# Patient Record
Sex: Female | Born: 1955 | Race: White | Hispanic: No | Smoking: Former smoker
Health system: Southern US, Community
[De-identification: ages and names within clinical notes are randomized; demographics above are authoritative.]

## PROBLEM LIST (undated history)

## (undated) DIAGNOSIS — W3400XA Accidental discharge from unspecified firearms or gun, initial encounter: Secondary | ICD-10-CM

## (undated) DIAGNOSIS — D649 Anemia, unspecified: Secondary | ICD-10-CM

## (undated) DIAGNOSIS — F0390 Unspecified dementia without behavioral disturbance: Secondary | ICD-10-CM

## (undated) DIAGNOSIS — F3111 Bipolar disorder, current episode manic without psychotic features, mild: Secondary | ICD-10-CM

## (undated) DIAGNOSIS — R51 Headache: Secondary | ICD-10-CM

## (undated) DIAGNOSIS — S069X9A Unspecified intracranial injury with loss of consciousness of unspecified duration, initial encounter: Secondary | ICD-10-CM

## (undated) DIAGNOSIS — E785 Hyperlipidemia, unspecified: Secondary | ICD-10-CM

## (undated) DIAGNOSIS — M549 Dorsalgia, unspecified: Secondary | ICD-10-CM

## (undated) HISTORY — DX: Accidental discharge from unspecified firearms or gun, initial encounter: W34.00XA

## (undated) HISTORY — PX: ANKLE FRACTURE SURGERY: SHX122

## (undated) HISTORY — PX: LEG SURGERY: SHX1003

## (undated) HISTORY — DX: Anemia, unspecified: D64.9

## (undated) HISTORY — DX: Dorsalgia, unspecified: M54.9

## (undated) HISTORY — DX: Unspecified dementia, unspecified severity, without behavioral disturbance, psychotic disturbance, mood disturbance, and anxiety: F03.90

## (undated) HISTORY — DX: Hyperlipidemia, unspecified: E78.5

## (undated) HISTORY — DX: Bipolar disorder, current episode manic without psychotic features, mild: F31.11

---
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
## 1994-06-27 DIAGNOSIS — S069X9A Unspecified intracranial injury with loss of consciousness of unspecified duration, initial encounter: Secondary | ICD-10-CM

## 1994-06-27 HISTORY — DX: Unspecified intracranial injury with loss of consciousness of unspecified duration, initial encounter: S06.9X9A

## 1997-10-29 ENCOUNTER — Emergency Department (HOSPITAL_COMMUNITY): Admission: EM | Admit: 1997-10-29 | Discharge: 1997-10-29 | Payer: Self-pay | Admitting: Emergency Medicine

## 1998-02-08 ENCOUNTER — Emergency Department (HOSPITAL_COMMUNITY): Admission: EM | Admit: 1998-02-08 | Discharge: 1998-02-08 | Payer: Self-pay | Admitting: Emergency Medicine

## 1998-03-13 ENCOUNTER — Ambulatory Visit (HOSPITAL_COMMUNITY): Admission: RE | Admit: 1998-03-13 | Discharge: 1998-03-13 | Payer: Self-pay

## 1999-01-08 ENCOUNTER — Encounter: Payer: Self-pay | Admitting: Specialist

## 1999-01-09 ENCOUNTER — Inpatient Hospital Stay (HOSPITAL_COMMUNITY): Admission: EM | Admit: 1999-01-09 | Discharge: 1999-01-25 | Payer: Self-pay

## 1999-01-21 ENCOUNTER — Encounter: Payer: Self-pay | Admitting: Specialist

## 1999-03-12 ENCOUNTER — Encounter: Payer: Self-pay | Admitting: Specialist

## 1999-03-12 ENCOUNTER — Inpatient Hospital Stay (HOSPITAL_COMMUNITY): Admission: EM | Admit: 1999-03-12 | Discharge: 1999-03-18 | Payer: Self-pay | Admitting: Specialist

## 1999-04-08 ENCOUNTER — Encounter: Admission: RE | Admit: 1999-04-08 | Discharge: 1999-04-08 | Payer: Self-pay | Admitting: Specialist

## 1999-06-17 ENCOUNTER — Emergency Department (HOSPITAL_COMMUNITY): Admission: EM | Admit: 1999-06-17 | Discharge: 1999-06-17 | Payer: Self-pay | Admitting: *Deleted

## 2005-04-28 ENCOUNTER — Emergency Department (HOSPITAL_COMMUNITY): Admission: EM | Admit: 2005-04-28 | Discharge: 2005-04-28 | Payer: Self-pay | Admitting: *Deleted

## 2005-05-04 ENCOUNTER — Ambulatory Visit: Payer: Self-pay | Admitting: Internal Medicine

## 2005-05-05 ENCOUNTER — Ambulatory Visit (HOSPITAL_COMMUNITY): Admission: RE | Admit: 2005-05-05 | Discharge: 2005-05-05 | Payer: Self-pay | Admitting: Internal Medicine

## 2005-07-06 ENCOUNTER — Ambulatory Visit: Payer: Self-pay | Admitting: Internal Medicine

## 2005-07-08 ENCOUNTER — Ambulatory Visit (HOSPITAL_COMMUNITY): Admission: RE | Admit: 2005-07-08 | Discharge: 2005-07-08 | Payer: Self-pay | Admitting: Internal Medicine

## 2006-02-11 ENCOUNTER — Emergency Department (HOSPITAL_COMMUNITY): Admission: EM | Admit: 2006-02-11 | Discharge: 2006-02-11 | Payer: Self-pay | Admitting: Emergency Medicine

## 2006-02-16 ENCOUNTER — Ambulatory Visit: Payer: Self-pay | Admitting: Internal Medicine

## 2006-03-02 ENCOUNTER — Ambulatory Visit (HOSPITAL_COMMUNITY): Admission: RE | Admit: 2006-03-02 | Discharge: 2006-03-02 | Payer: Self-pay | Admitting: Obstetrics

## 2006-12-04 ENCOUNTER — Encounter (INDEPENDENT_AMBULATORY_CARE_PROVIDER_SITE_OTHER): Payer: Self-pay | Admitting: Infectious Diseases

## 2006-12-04 ENCOUNTER — Ambulatory Visit: Payer: Self-pay | Admitting: Internal Medicine

## 2006-12-04 DIAGNOSIS — D649 Anemia, unspecified: Secondary | ICD-10-CM

## 2006-12-04 DIAGNOSIS — R4182 Altered mental status, unspecified: Secondary | ICD-10-CM

## 2006-12-04 DIAGNOSIS — F3110 Bipolar disorder, current episode manic without psychotic features, unspecified: Secondary | ICD-10-CM | POA: Insufficient documentation

## 2006-12-04 LAB — CONVERTED CEMR LAB
ALT: <8 units/L (ref 0–35)
AST: 11 units/L (ref 0–37)
Albumin: 4.2 g/dL (ref 3.5–5.2)
Alkaline Phosphatase: 53 units/L (ref 39–117)
Amphetamine Screen, Ur: NEGATIVE
BUN: 21 mg/dL (ref 6–23)
Barbiturate Quant, Ur: NEGATIVE
Benzodiazepines.: NEGATIVE
CO2: 26 meq/L (ref 19–32)
Calcium: 9.9 mg/dL (ref 8.4–10.5)
Chloride: 105 meq/L (ref 96–112)
Cocaine Metabolites: NEGATIVE
Creatinine, Ser: 0.94 mg/dL (ref 0.40–1.20)
Creatinine,U: 381.8 mg/dL
Glucose, Bld: 77 mg/dL (ref 70–99)
HCT: 44.2 % (ref 36.0–46.0)
Hemoglobin: 14.5 g/dL (ref 12.0–15.0)
MCHC: 32.8 g/dL (ref 30.0–36.0)
MCV: 91.5 fL (ref 78.0–100.0)
Marijuana Metabolite: NEGATIVE
Methadone: NEGATIVE
Opiates: NEGATIVE
Phencyclidine (PCP): NEGATIVE
Platelets: 213 10*3/uL (ref 150–400)
Potassium: 4.4 meq/L (ref 3.5–5.3)
Propoxyphene: NEGATIVE
RBC: 4.83 M/uL (ref 3.87–5.11)
RDW: 13.6 % (ref 11.5–14.0)
Sodium: 143 meq/L (ref 135–145)
TSH: 2.587 microintl units/mL (ref 0.350–5.50)
Total Bilirubin: 0.6 mg/dL (ref 0.3–1.2)
Total Protein: 7 g/dL (ref 6.0–8.3)
Valproic Acid Lvl: 67.6 ug/mL (ref 50.0–100.0)
Vitamin B-12: 507 pg/mL (ref 211–911)
WBC: 4.8 10*3/uL (ref 4.0–10.5)

## 2006-12-08 ENCOUNTER — Encounter (INDEPENDENT_AMBULATORY_CARE_PROVIDER_SITE_OTHER): Payer: Self-pay | Admitting: Infectious Diseases

## 2006-12-14 ENCOUNTER — Telehealth: Payer: Self-pay | Admitting: *Deleted

## 2006-12-15 ENCOUNTER — Ambulatory Visit (HOSPITAL_COMMUNITY): Admission: RE | Admit: 2006-12-15 | Discharge: 2006-12-15 | Payer: Self-pay | Admitting: Infectious Diseases

## 2007-02-01 ENCOUNTER — Ambulatory Visit: Payer: Self-pay | Admitting: Infectious Disease

## 2007-02-13 ENCOUNTER — Ambulatory Visit (HOSPITAL_COMMUNITY): Admission: RE | Admit: 2007-02-13 | Discharge: 2007-02-13 | Payer: Self-pay | Admitting: Internal Medicine

## 2007-03-19 ENCOUNTER — Encounter (INDEPENDENT_AMBULATORY_CARE_PROVIDER_SITE_OTHER): Payer: Self-pay | Admitting: Internal Medicine

## 2007-03-19 ENCOUNTER — Ambulatory Visit: Payer: Self-pay | Admitting: Internal Medicine

## 2007-03-19 DIAGNOSIS — R634 Abnormal weight loss: Secondary | ICD-10-CM

## 2007-03-19 LAB — CONVERTED CEMR LAB
Cholesterol: 215 mg/dL — ABNORMAL HIGH (ref 0–200)
HDL: 58 mg/dL (ref >39)
LDL Cholesterol: 126 mg/dL — ABNORMAL HIGH (ref 0–99)
Total CHOL/HDL Ratio: 3.7
Triglycerides: 153 mg/dL — ABNORMAL HIGH (ref <150)
VLDL: 31 mg/dL (ref 0–40)

## 2007-05-10 ENCOUNTER — Ambulatory Visit: Payer: Self-pay | Admitting: Internal Medicine

## 2007-06-06 ENCOUNTER — Encounter (INDEPENDENT_AMBULATORY_CARE_PROVIDER_SITE_OTHER): Payer: Self-pay | Admitting: Internal Medicine

## 2007-07-16 ENCOUNTER — Encounter (INDEPENDENT_AMBULATORY_CARE_PROVIDER_SITE_OTHER): Payer: Self-pay | Admitting: Internal Medicine

## 2007-08-13 ENCOUNTER — Encounter (INDEPENDENT_AMBULATORY_CARE_PROVIDER_SITE_OTHER): Payer: Self-pay | Admitting: Internal Medicine

## 2007-08-29 ENCOUNTER — Encounter (INDEPENDENT_AMBULATORY_CARE_PROVIDER_SITE_OTHER): Payer: Self-pay | Admitting: Internal Medicine

## 2007-08-29 ENCOUNTER — Ambulatory Visit: Payer: Self-pay | Admitting: *Deleted

## 2007-09-04 ENCOUNTER — Ambulatory Visit: Payer: Self-pay | Admitting: Hospitalist

## 2007-09-13 ENCOUNTER — Encounter (INDEPENDENT_AMBULATORY_CARE_PROVIDER_SITE_OTHER): Payer: Self-pay | Admitting: Internal Medicine

## 2008-06-05 ENCOUNTER — Ambulatory Visit: Payer: Self-pay | Admitting: Internal Medicine

## 2008-06-22 IMAGING — US US PELVIS COMPLETE MODIFY
1 series · 13 of 25 positions shown · non-contrast
Comparison: none

CLINICAL DATA: No menstrual cycles for 10 years with menses beginning in Sunday December, 2005.
 TRANSABDOMINAL AND TRANSVAGINAL PELVIC ULTRASOUND ? 03/02/06:
TECHNIQUE: Both transabdominal and transvaginal ultrasound examinations of the pelvis were performed including evaluation of the uterus, ovaries, adnexal regions, and pelvic cul-de-sac.

[Series 1: us pelvis complete modify · 0.35mm/px · 13 of 48 slices shown]
[im 1/48]
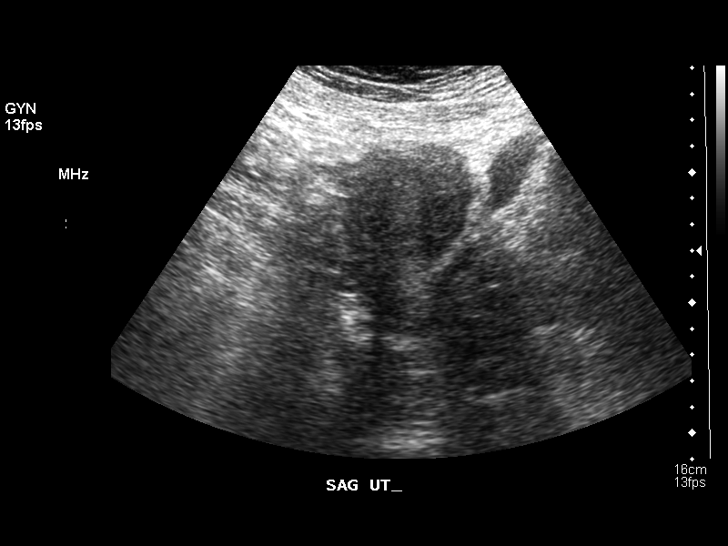
[im 4/48]
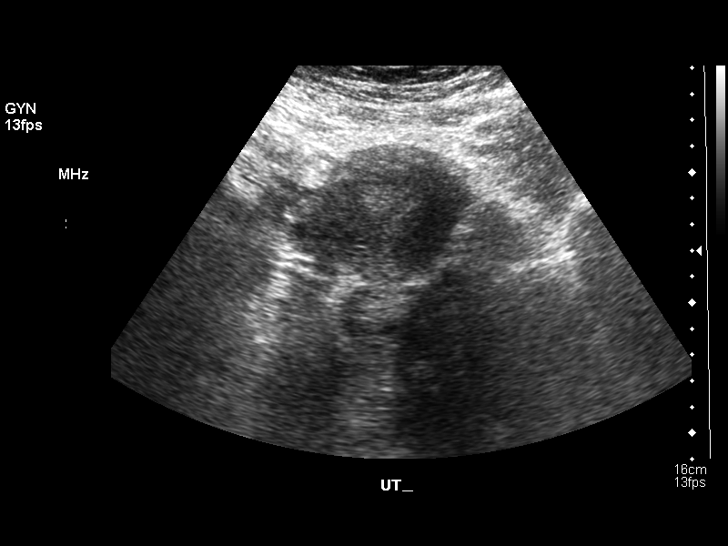
[im 8/48]
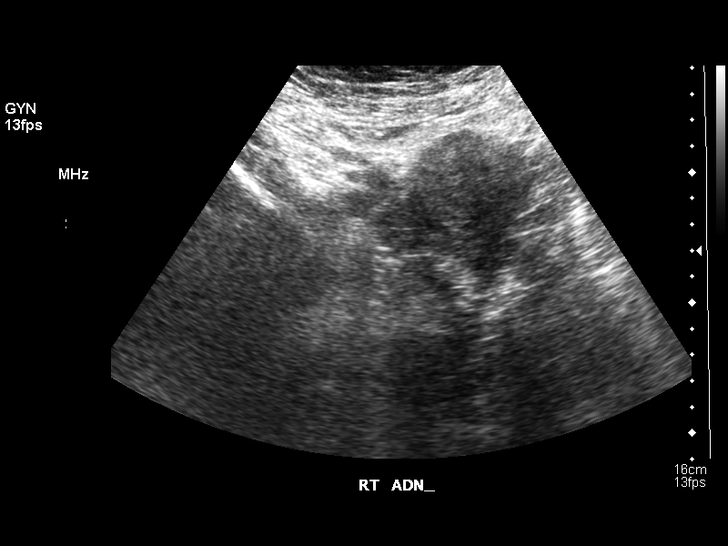
[im 12/48]
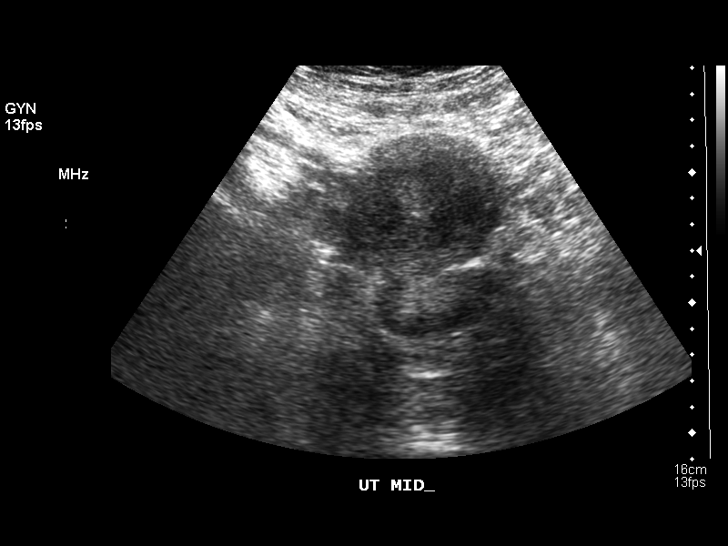
[im 16/48]
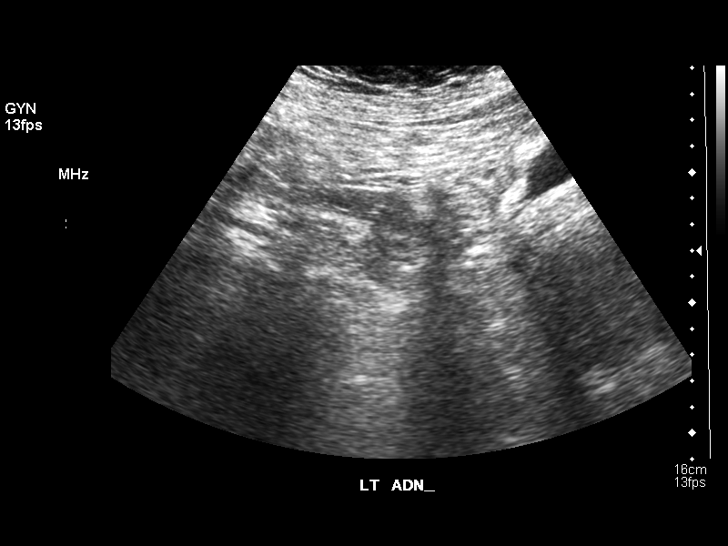
[im 20/48]
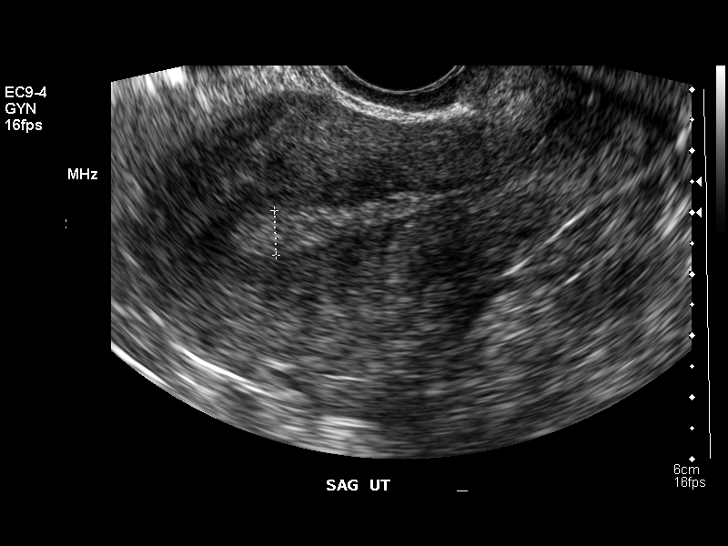
[im 24/48]
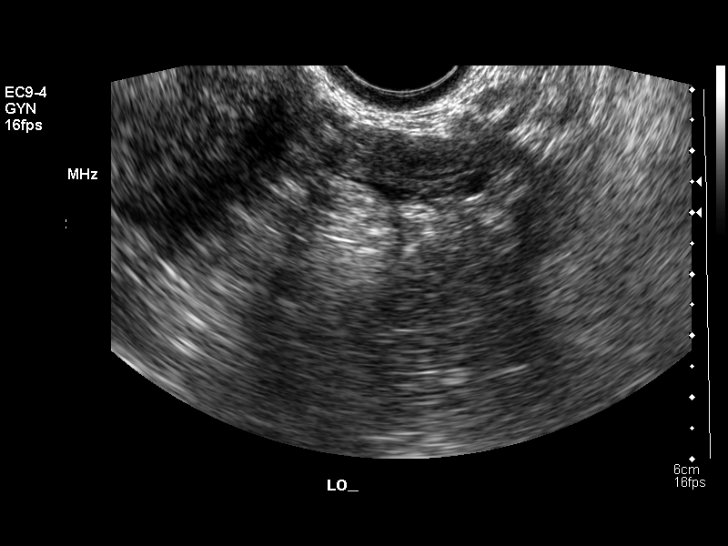
[im 28/48]
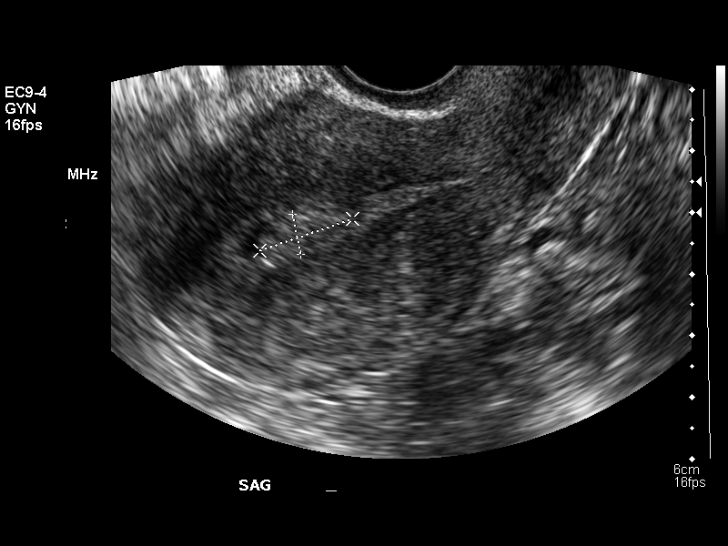
[im 32/48]
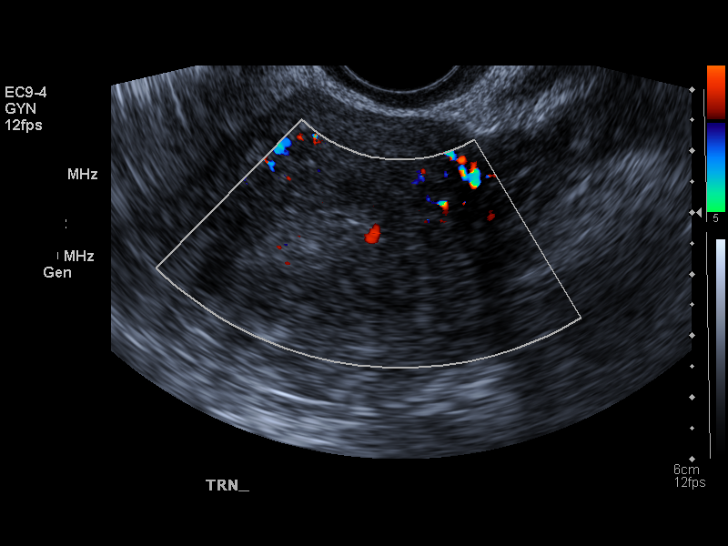
[im 36/48]
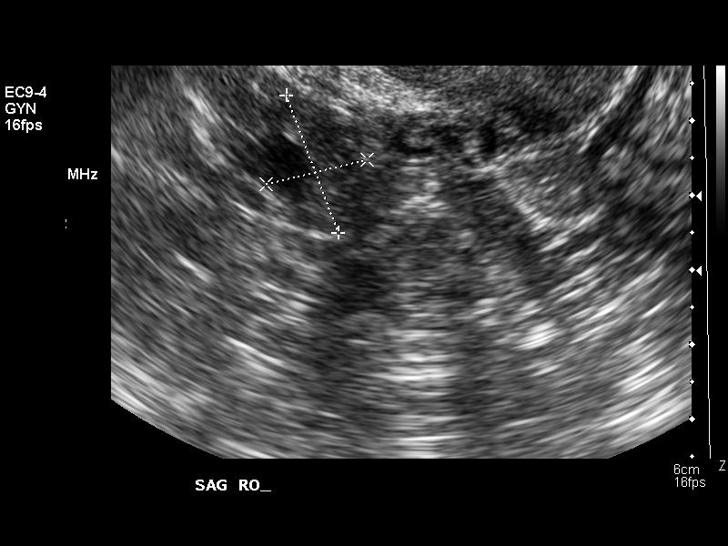
[im 40/48]
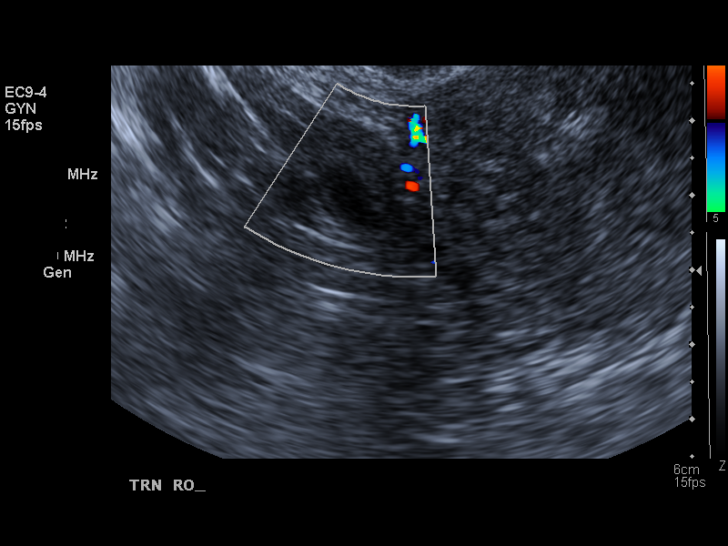
[im 44/48]
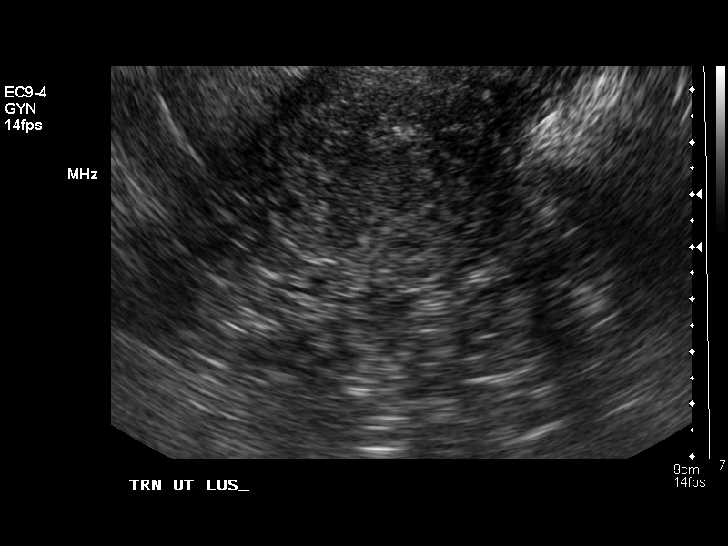
[im 48/48]
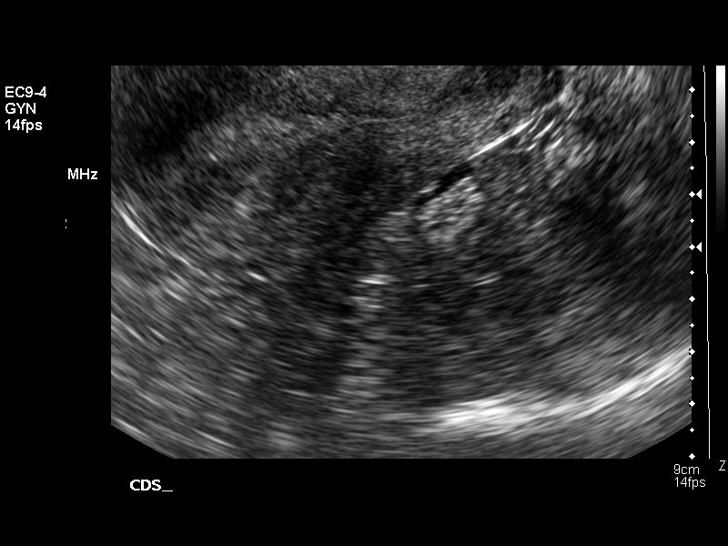

[13 of 25 positions shown; findings below may reference images not displayed]

FINDINGS: Multiple images of the uterus and adnexa were obtained using transabdominal and endovaginal approaches.  The uterus has a sagittal length of 7.4 cm, an AP width of 4.8 cm, and a transverse width of 5.4 cm.  A homogeneous uterine myometrium is seen.  
 The endometrial canal has a small amount of fluid within the canal.  There is a focal area of thickening identified emanating off of the right lateral upper uterine segment portion of the endometrial canal measuring 1.6 x 0.7 x 1.0 cm.  While no clearly defined feeding vessel could be identified with color Doppler assessment, the appearance is most compatible with a focal polyp.  The remainder of the endometrial lining is thin with a single layer measurement of 1.9 mm.
 Both ovaries are seen with the right ovary measuring 1.2 x 2.0 x 1.4 cm.  This contains a unilocular simple cyst measuring 0.9 x 0.7 cm.  Given the patient?s history of 10 years of amenses, this most likely represents a small benign postmenopausal cyst, and follow-up can be undertaken in 3 months for initial short-term reassessment.  The left ovary has a normal appearance measuring 1.9 x 2.2 x 1.5 cm.  No cul-de-sac or paraovarian fluid is seen, and no separate adnexal masses are noted.
IMPRESSION: 1.  Findings suggestive of a focal endometrial polyp with size and location as described above. 
 2.  Small unilocular simple cyst in the right ovary most compatible with a small benign postmenopausal cyst.  Short-term follow-up in three months would be recommended for initial short-term reassessment.  Normal left ovary and myometrium.

## 2008-09-22 ENCOUNTER — Ambulatory Visit: Payer: Self-pay | Admitting: Internal Medicine

## 2008-09-23 ENCOUNTER — Encounter (INDEPENDENT_AMBULATORY_CARE_PROVIDER_SITE_OTHER): Payer: Self-pay | Admitting: Internal Medicine

## 2008-09-24 ENCOUNTER — Encounter (INDEPENDENT_AMBULATORY_CARE_PROVIDER_SITE_OTHER): Payer: Self-pay | Admitting: Internal Medicine

## 2008-09-24 ENCOUNTER — Ambulatory Visit: Payer: Self-pay | Admitting: Internal Medicine

## 2008-09-24 ENCOUNTER — Ambulatory Visit (HOSPITAL_COMMUNITY): Admission: RE | Admit: 2008-09-24 | Discharge: 2008-09-24 | Payer: Self-pay | Admitting: Internal Medicine

## 2008-09-24 LAB — CONVERTED CEMR LAB
ALT: 8 units/L (ref 0–35)
AST: 9 units/L (ref 0–37)
Albumin: 3.9 g/dL (ref 3.5–5.2)
Alkaline Phosphatase: 61 units/L (ref 39–117)
BUN: 19 mg/dL (ref 6–23)
Basophils Absolute: 0 10*3/uL (ref 0.0–0.1)
Basophils Relative: 0 % (ref 0–1)
CO2: 23 meq/L (ref 19–32)
Calcium: 8.9 mg/dL (ref 8.4–10.5)
Chloride: 108 meq/L (ref 96–112)
Cholesterol: 237 mg/dL — ABNORMAL HIGH (ref 0–200)
Creatinine, Ser: 0.95 mg/dL (ref 0.40–1.20)
Eosinophils Absolute: 0.1 10*3/uL (ref 0.0–0.7)
Eosinophils Relative: 2 % (ref 0–5)
GFR calc Af Amer: >60 mL/min (ref >60)
GFR calc non Af Amer: >60 mL/min (ref >60)
Glucose, Bld: 91 mg/dL (ref 70–99)
HCT: 37 % (ref 36.0–46.0)
HDL: 49 mg/dL (ref >39)
Hemoglobin: 12.5 g/dL (ref 12.0–15.0)
LDL Cholesterol: 123 mg/dL — ABNORMAL HIGH (ref 0–99)
Lymphocytes Relative: 33 % (ref 12–46)
Lymphs Abs: 2 10*3/uL (ref 0.7–4.0)
MCHC: 33.8 g/dL (ref 30.0–36.0)
MCV: 88.7 fL (ref 78.0–100.0)
Monocytes Absolute: 0.5 10*3/uL (ref 0.1–1.0)
Monocytes Relative: 8 % (ref 3–12)
Neutro Abs: 3.5 10*3/uL (ref 1.7–7.7)
Neutrophils Relative %: 57 % (ref 43–77)
Platelets: 220 10*3/uL (ref 150–400)
Potassium: 4.4 meq/L (ref 3.5–5.3)
RBC: 4.17 M/uL (ref 3.87–5.11)
RDW: 14.5 % (ref 11.5–15.5)
Sodium: 143 meq/L (ref 135–145)
Total Bilirubin: 0.2 mg/dL — ABNORMAL LOW (ref 0.3–1.2)
Total CHOL/HDL Ratio: 4.8
Total Protein: 6.5 g/dL (ref 6.0–8.3)
Triglycerides: 324 mg/dL — ABNORMAL HIGH (ref <150)
VLDL: 65 mg/dL — ABNORMAL HIGH (ref 0–40)
Valproic Acid Lvl: 63.3 ug/mL (ref 50.0–100.0)
WBC: 6 10*3/uL (ref 4.0–10.5)

## 2008-10-13 ENCOUNTER — Telehealth (INDEPENDENT_AMBULATORY_CARE_PROVIDER_SITE_OTHER): Payer: Self-pay | Admitting: Internal Medicine

## 2008-10-13 ENCOUNTER — Encounter (INDEPENDENT_AMBULATORY_CARE_PROVIDER_SITE_OTHER): Payer: Self-pay | Admitting: Internal Medicine

## 2008-11-10 ENCOUNTER — Encounter (INDEPENDENT_AMBULATORY_CARE_PROVIDER_SITE_OTHER): Payer: Self-pay | Admitting: Internal Medicine

## 2008-11-12 ENCOUNTER — Telehealth: Payer: Self-pay | Admitting: *Deleted

## 2008-11-12 ENCOUNTER — Encounter (INDEPENDENT_AMBULATORY_CARE_PROVIDER_SITE_OTHER): Payer: Self-pay | Admitting: Internal Medicine

## 2008-11-12 ENCOUNTER — Ambulatory Visit: Payer: Self-pay | Admitting: *Deleted

## 2008-11-12 DIAGNOSIS — R197 Diarrhea, unspecified: Secondary | ICD-10-CM | POA: Insufficient documentation

## 2008-11-12 LAB — CONVERTED CEMR LAB
Basophils Absolute: 0 10*3/uL (ref 0.0–0.1)
Basophils Relative: 0 % (ref 0–1)
Bilirubin Urine: NEGATIVE
Eosinophils Absolute: 0 10*3/uL (ref 0.0–0.7)
Eosinophils Relative: 0 % (ref 0–5)
Glucose, Urine, Semiquant: NEGATIVE
HCT: 41 % (ref 36.0–46.0)
Hemoglobin: 13.6 g/dL (ref 12.0–15.0)
Lymphocytes Relative: 12 % (ref 12–46)
Lymphs Abs: 0.8 10*3/uL (ref 0.7–4.0)
MCHC: 33.2 g/dL (ref 30.0–36.0)
MCV: 85.2 fL (ref 78.0–100.0)
Monocytes Absolute: 0.9 10*3/uL (ref 0.1–1.0)
Monocytes Relative: 13 % — ABNORMAL HIGH (ref 3–12)
Neutro Abs: 5.1 10*3/uL (ref 1.7–7.7)
Neutrophils Relative %: 75 % (ref 43–77)
Nitrite: NEGATIVE
Platelets: 228 10*3/uL (ref 150–400)
Protein, U semiquant: 100
RBC: 4.81 M/uL (ref 3.87–5.11)
RDW: 14.8 % (ref 11.5–15.5)
Specific Gravity, Urine: >=1.03
Urobilinogen, UA: 0.2
WBC: 6.7 10*3/uL (ref 4.0–10.5)
pH: 6

## 2009-04-23 ENCOUNTER — Encounter (INDEPENDENT_AMBULATORY_CARE_PROVIDER_SITE_OTHER): Payer: Self-pay | Admitting: Internal Medicine

## 2009-05-19 ENCOUNTER — Telehealth (INDEPENDENT_AMBULATORY_CARE_PROVIDER_SITE_OTHER): Payer: Self-pay | Admitting: Internal Medicine

## 2009-08-13 ENCOUNTER — Ambulatory Visit: Payer: Self-pay | Admitting: Internal Medicine

## 2009-08-13 ENCOUNTER — Encounter (INDEPENDENT_AMBULATORY_CARE_PROVIDER_SITE_OTHER): Payer: Self-pay | Admitting: Internal Medicine

## 2009-08-13 LAB — CONVERTED CEMR LAB
ALT: 14 units/L (ref 0–35)
AST: 23 units/L (ref 0–37)
Albumin: 3.7 g/dL (ref 3.5–5.2)
Alkaline Phosphatase: 62 units/L (ref 39–117)
BUN: 22 mg/dL (ref 6–23)
CO2: 23 meq/L (ref 19–32)
Calcium: 9.4 mg/dL (ref 8.4–10.5)
Candida species: NEGATIVE
Chlamydia, DNA Probe: NEGATIVE
Creatinine, Ser: 1.08 mg/dL (ref 0.40–1.20)
GC Probe Amp, Genital: NEGATIVE
Gardnerella vaginalis: NEGATIVE
Glucose, Bld: 81 mg/dL (ref 70–99)
Pap Smear: NEGATIVE
Potassium: 4.6 meq/L (ref 3.5–5.3)
Sodium: 142 meq/L (ref 135–145)
Total Bilirubin: 0.2 mg/dL — ABNORMAL LOW (ref 0.3–1.2)
Total Protein: 6.3 g/dL (ref 6.0–8.3)

## 2009-08-13 LAB — HM PAP SMEAR: HM Pap smear: NEGATIVE

## 2009-08-26 LAB — CONVERTED CEMR LAB
HDL: 44 mg/dL (ref >39)
Total CHOL/HDL Ratio: 6.5
Triglycerides: 526 mg/dL — ABNORMAL HIGH (ref <150)

## 2009-10-01 ENCOUNTER — Telehealth (INDEPENDENT_AMBULATORY_CARE_PROVIDER_SITE_OTHER): Payer: Self-pay | Admitting: Internal Medicine

## 2009-10-06 ENCOUNTER — Encounter (INDEPENDENT_AMBULATORY_CARE_PROVIDER_SITE_OTHER): Payer: Self-pay | Admitting: Internal Medicine

## 2009-10-30 ENCOUNTER — Ambulatory Visit: Payer: Self-pay | Admitting: Internal Medicine

## 2009-11-16 ENCOUNTER — Ambulatory Visit: Payer: Self-pay | Admitting: Internal Medicine

## 2009-11-17 LAB — CONVERTED CEMR LAB
ALT: 9 units/L (ref 0–35)
AST: 15 units/L (ref 0–37)
Albumin: 3.8 g/dL (ref 3.5–5.2)
Alkaline Phosphatase: 59 units/L (ref 39–117)
BUN: 28 mg/dL — ABNORMAL HIGH (ref 6–23)
Bilirubin, Direct: 0.1 mg/dL (ref 0.0–0.3)
CO2: 27 meq/L (ref 19–32)
Calcium: 9.6 mg/dL (ref 8.4–10.5)
Chloride: 106 meq/L (ref 96–112)
Cholesterol: 308 mg/dL — ABNORMAL HIGH (ref 0–200)
Creatinine, Ser: 1.04 mg/dL (ref 0.40–1.20)
Glucose, Bld: 85 mg/dL (ref 70–99)
HCT: 40.3 % (ref 36.0–46.0)
HDL: 52 mg/dL (ref >39)
Hemoglobin: 12.3 g/dL (ref 12.0–15.0)
Indirect Bilirubin: 0.2 mg/dL (ref 0.0–0.9)
LDL Cholesterol: 190 mg/dL — ABNORMAL HIGH (ref 0–99)
MCHC: 30.5 g/dL (ref 30.0–36.0)
MCV: 92.2 fL (ref >=78.0)
Platelets: 231 10*3/uL (ref 150–400)
Potassium: 4.8 meq/L (ref 3.5–5.3)
RBC: 4.37 M/uL (ref 3.87–5.11)
RDW: 14.5 % (ref 11.5–15.5)
Sodium: 143 meq/L (ref 135–145)
Total Bilirubin: 0.3 mg/dL (ref 0.3–1.2)
Total CHOL/HDL Ratio: 5.9
Total Protein: 6.3 g/dL (ref 6.0–8.3)
Triglycerides: 331 mg/dL — ABNORMAL HIGH (ref <150)
VLDL: 66 mg/dL — ABNORMAL HIGH (ref 0–40)
Valproic Acid Lvl: 72.1 ug/mL (ref >=50.0)
WBC: 5.2 10*3/uL (ref 4.0–10.5)

## 2009-12-01 ENCOUNTER — Telehealth (INDEPENDENT_AMBULATORY_CARE_PROVIDER_SITE_OTHER): Payer: Self-pay | Admitting: Internal Medicine

## 2009-12-16 ENCOUNTER — Telehealth: Payer: Self-pay | Admitting: Internal Medicine

## 2009-12-30 ENCOUNTER — Telehealth (INDEPENDENT_AMBULATORY_CARE_PROVIDER_SITE_OTHER): Payer: Self-pay | Admitting: *Deleted

## 2010-01-20 ENCOUNTER — Ambulatory Visit: Payer: Self-pay | Admitting: Internal Medicine

## 2010-01-20 DIAGNOSIS — E785 Hyperlipidemia, unspecified: Secondary | ICD-10-CM | POA: Insufficient documentation

## 2010-01-20 LAB — CONVERTED CEMR LAB
AST: 11 units/L (ref 0–37)
Albumin: 3.9 g/dL (ref 3.5–5.2)
Alkaline Phosphatase: 69 units/L (ref 39–117)
BUN: 17 mg/dL (ref 6–23)
Chloride: 104 meq/L (ref 96–112)
Cholesterol: 290 mg/dL — ABNORMAL HIGH (ref 0–200)
Potassium: 5.5 meq/L — ABNORMAL HIGH (ref 3.5–5.3)
Sodium: 142 meq/L (ref 135–145)
Total Bilirubin: 0.3 mg/dL (ref 0.3–1.2)
Total CHOL/HDL Ratio: 6.3
Total Protein: 6.6 g/dL (ref 6.0–8.3)

## 2010-01-28 ENCOUNTER — Ambulatory Visit (HOSPITAL_COMMUNITY): Admission: RE | Admit: 2010-01-28 | Discharge: 2010-01-28 | Payer: Self-pay | Admitting: Internal Medicine

## 2010-01-28 LAB — HM MAMMOGRAPHY: HM Mammogram: NEGATIVE

## 2010-02-23 ENCOUNTER — Ambulatory Visit: Payer: Self-pay | Admitting: Internal Medicine

## 2010-03-11 ENCOUNTER — Ambulatory Visit (HOSPITAL_COMMUNITY): Admission: RE | Admit: 2010-03-11 | Discharge: 2010-03-11 | Payer: Self-pay | Admitting: Gastroenterology

## 2010-04-08 ENCOUNTER — Ambulatory Visit (HOSPITAL_COMMUNITY)
Admission: RE | Admit: 2010-04-08 | Discharge: 2010-04-08 | Payer: Self-pay | Source: Home / Self Care | Admitting: Gastroenterology

## 2010-05-05 ENCOUNTER — Emergency Department (HOSPITAL_COMMUNITY): Admission: EM | Admit: 2010-05-05 | Discharge: 2010-05-05 | Payer: Self-pay | Admitting: Emergency Medicine

## 2010-05-25 ENCOUNTER — Ambulatory Visit: Payer: Self-pay | Admitting: Internal Medicine

## 2010-05-25 DIAGNOSIS — M549 Dorsalgia, unspecified: Secondary | ICD-10-CM | POA: Insufficient documentation

## 2010-05-31 ENCOUNTER — Telehealth (INDEPENDENT_AMBULATORY_CARE_PROVIDER_SITE_OTHER): Payer: Self-pay | Admitting: *Deleted

## 2010-07-18 ENCOUNTER — Encounter: Payer: Self-pay | Admitting: Infectious Diseases

## 2010-07-18 ENCOUNTER — Encounter: Payer: Self-pay | Admitting: *Deleted

## 2010-07-27 ENCOUNTER — Telehealth: Payer: Self-pay | Admitting: Internal Medicine

## 2010-07-29 NOTE — Progress Notes (Signed)
Summary: med refill/gp  Phone Note Refill Request Message from:  Fax from Pharmacy on December 01, 2009 2:41 PM  Refills Requested: Medication #1:  HEMOCYTE 324 MG TABS Take 1 tablet by mouth once a day   Last Refilled: 11/19/2009  Method Requested: Electronic Initial call taken by: Chinita Pester RN,  December 01, 2009 2:41 PM  Follow-up for Phone Call       Follow-up by: Jason Coop MD,  December 01, 2009 3:18 PM    Prescriptions: HEMOCYTE 324 MG TABS (FERROUS FUMARATE) Take 1 tablet by mouth once a day  #30 x 2   Entered and Authorized by:   Jason Coop MD   Signed by:   Jason Coop MD on 12/01/2009   Method used:   Electronically to        [facility]* (retail)       [address]       [place], [place]  [id number]       Ph: [phone]       Fax: [phone]   RxID:   [id number]

## 2010-07-29 NOTE — Letter (Signed)
Summary: AGI : Medical Exam Form  AGI : Medical Exam Form   Imported By: Florinda Marker 08/13/2009 16:28:48  _____________________________________________________________________  External Attachment:    Type:   Image     Comment:   External Document

## 2010-07-29 NOTE — Letter (Signed)
Summary: [facility]  [facility]/PERSONAL CARE   Imported By: Margie Billet 10/07/2009 13:49:49  _____________________________________________________________________  External Attachment:    Type:   Image     Comment:   External Document
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
## 2010-07-29 NOTE — Progress Notes (Signed)
Summary: Refill/gh  Phone Note Refill Request Message from:  Fax from Pharmacy on May 31, 2010 10:04 AM  Refills Requested: Medication #1:  HEMOCYTE 324 MG TABS Take 1 tablet by mouth once a day   Last Refilled: 04/26/2010 Last office visit was 05/25/2010.  Last labs were 01/20/2010.   Method Requested: Electronic Initial call taken by: Angelina Ok RN,  May 31, 2010 10:04 AM    Prescriptions: HEMOCYTE 324 MG TABS (FERROUS FUMARATE) Take 1 tablet by mouth once a day  #30 Each x 6   Entered and Authorized by:   Zoila Shutter MD   Signed by:   Zoila Shutter MD on 05/31/2010   Method used:   Electronically to        [facility]* (retail)       [address]       [place], [place]  [address]       Ph: [phone]       Fax: [phone]   RxID:   [id number]
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
## 2010-07-29 NOTE — Progress Notes (Signed)
Summary: refill/gg  Phone Note Refill Request  on December 16, 2009 4:02 PM  Refills Requested: Medication #1:  PRAVACHOL 20 MG TABS take 1 pill by mouth daily..   Last Refilled: 11/17/2009  Method Requested: Electronic Initial call taken by: Merrie Roof RN,  December 16, 2009 4:02 PM  Follow-up for Phone Call        Pts LDL in 190's.  Statin started 5/23.  LFT's were nl 5/23.  Pt will need appt end of Aug for FLP and LFT's  Sent Ms Lissa Hoard a flag to schedule appt.  Will refill 3 months. Follow-up by: Blanch Media MD,  December 16, 2009 4:12 PM    Prescriptions: PRAVACHOL 20 MG TABS (PRAVASTATIN SODIUM) take 1 pill by mouth daily.  #30 x 2   Entered and Authorized by:   Blanch Media MD   Signed by:   Blanch Media MD on 12/16/2009   Method used:   Electronically to        [facility]* (retail)       [address]       [place], [place]  [id number]       Ph: [phone]       Fax: [phone]   RxID:   [id number]

## 2010-07-29 NOTE — Assessment & Plan Note (Signed)
Summary: EST-PER MENTAL HEALTH CHOLESTEROL IS TOO HIGH CHANGED MEDS/CH   Vital Signs:  Patient profile:   55 year old female Height:      63.9 inches Weight:      212.9 pounds BMI:     36.79 Temp:     97.8 degrees F oral Pulse rate:   66 / minute BP sitting:   100 / 70  (right arm)  Vitals Entered By: Filomena Jungling NT II (Oct 30, 2009 10:35 AM) CC: ned refill on depokote, needs labs drawn, Is Patient Diabetic? No Pain Assessment Patient in pain? no      Nutritional Status BMI of > 30 = obese  Have you ever been in a relationship where you felt threatened, hurt or afraid?No   Does patient need assistance? Ambulation Impaired:Risk for fall Comments lives in a group home   Primary Care Provider:  Peggye Pitt MD  CC:  ned refill on depokote, needs labs drawn, and .  History of Present Illness: Kelly Chandler is a 55 yo lady with PMH as outlined in the EMR comes today for a f/u visit.   1. Bipolar disorder: She saw Dr. Chestine Spore and her seroquel dose has been decreased from 600 mg to 400 mg about a week ago on 10/20/09 to see if that will change her cholestrerol panel.   2. Screening colonoscopy: She has not done her colonoscopy.   3. When asked what is her main conern or question she denies any concern or question.  Preventive Screening-Counseling & Management  Alcohol-Tobacco     Smoking Status: quit     Year Quit: years ago  Caffeine-Diet-Exercise     Does Patient Exercise: yes     Type of exercise: WALKING     Times/week: 1-3  Current Medications (verified): 1)  Depakote 500 Mg Tbec (Divalproex Sodium) .... Take  2 Tablets By Mouth Once A Day, Prescribed By Mental Health Department. 2)  Prozac 20 Mg Caps (Fluoxetine Hcl) .... Take 3 Tablets By Mouth Once A Day, Prescribed By Mental Health Department. 3)  Hemocyte 324 Mg Tabs (Ferrous Fumarate) .... Take 1 Tablet By Mouth Once A Day 4)  Oxybutynin Chloride 10 Mg Xr24h-Tab (Oxybutynin Chloride) .... Take 1 Tablet By  Mouth Daily. 5)  Cvs Ibuprofen 200 Mg  Tabs (Ibuprofen) .Marland Kitchen.. 1 Tablet As Needed For Headaches For Up To Every 8 Hourly After Meal. 6)  Aricept 10 Mg  Tabs (Donepezil Hcl) .... Take 1 Tablet By Mouth Once A Day 7)  Seroquel 400 Mg Tabs (Quetiapine Fumarate) .... Take 1 Pill By Mouth Daily. 8)  Depakote 250 Mg Tbec (Divalproex Sodium) .... Take 1 Pill At Bedtime, Prescribed By Mental Health Department.  Allergies: No Known Drug Allergies  Review of Systems      See HPI  Physical Exam  General:  alert.   Mouth:  pharynx pink and moist.   Lungs:  normal breath sounds, no crackles, and no wheezes.   Heart:  normal rate, regular rhythm, and no murmur.   Extremities:  trace left pedal edema and trace right pedal edema.   Neurologic:  alert & oriented X3.     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 Chestine Spore on 5/24. Will get following labs on 4/22.   Orders: T-Hepatic Function [id number]) T-Basic Metabolic Panel ([id number]) T-CBC No Diff (  [id number])  Problem # 2:  ANEMIA NOS (ICD-285.9) Check CBC.   Her updated medication list for this problem includes:    Hemocyte 324 Mg Tabs (Ferrous fumarate) .Marland Kitchen... Take 1 tablet by mouth once a day  Orders: T-CBC No Diff ([id number])  Complete Medication List: 1)  Depakote 500 Mg Tbec (Divalproex sodium) .... Take  2 tablets by mouth once a day, prescribed by mental health department. 2)  Prozac 20 Mg Caps (Fluoxetine hcl) .... Take 3 tablets by mouth once a day, prescribed by mental health department. 3)  Hemocyte 324 Mg Tabs (Ferrous fumarate) .... Take 1 tablet by mouth once a day 4)  Oxybutynin Chloride 10 Mg Xr24h-tab (Oxybutynin chloride) .... Take 1 tablet by mouth daily. 5)  Cvs Ibuprofen 200 Mg Tabs (Ibuprofen) .Marland Kitchen.. 1 tablet as needed for headaches for up to every 8 hourly after meal. 6)   Aricept 10 Mg Tabs (Donepezil hcl) .... Take 1 tablet by mouth once a day 7)  Seroquel 400 Mg Tabs (Quetiapine fumarate) .... Take 1 pill by mouth daily. 8)  Depakote 250 Mg Tbec (Divalproex sodium) .... Take 1 pill at bedtime, prescribed by mental health department.  Other Orders: T-Lipid Profile [id number]) T- * Misc. Laboratory test [id number])  Patient Instructions: 1)  Please schedule a follow-up appointment in 3 months. 2)  Come to [facility] on 4/22 as you just started taking lower dose of seroquel on 4/26 and we are thinking seroqeul may have increased your triglycerides when we checked last time. It is too short to see a significant change in a week, so will get those tests on 4/22.  3)  Limit your Sodium (Salt) to less than 2 grams a day(slightly less than 1/2 a teaspoon) to prevent fluid retention, swelling, or worsening of symptoms. 4)  It is important that you exercise regularly at least 20 minutes 5 times a week. If you develop chest pain, have severe difficulty breathing, or feel very tired , stop exercising immediately and seek medical attention. 5)  You need to lose weight. Consider a lower calorie diet and regular exercise.  6)  Check your Blood Pressure regularly. If it is above: you should make an appointment.  Process Orders Check Orders Results:     [facility]: Order checked:     [id number] -- T- * Misc. Laboratory test -- No CPT codes found (CPT: ) Tests Sent for requisitioning (Nov 01, 2009 11:58 AM):     10/30/2009: [facility] -- T-Hepatic Function [id number] (signed)     10/30/2009: [facility] -- T-Lipid Profile [id number] (signed)     10/30/2009: [facility] -- T-Basic Metabolic Panel [id number] (signed)     10/30/2009: [facility] -- T-CBC No Diff [[id number]] (signed)     10/30/2009: [facility] -- T- * Misc. Laboratory test [phone] (signed)     Prevention &  Chronic Care Immunizations   Influenza vaccine: Fluvax 3+  (06/05/2008)   Influenza vaccine deferral: Deferred  (10/30/2009)    Tetanus booster: Not documented   Td booster deferral: Deferred  (10/30/2009)    Pneumococcal vaccine: Not documented  Colorectal Screening   Hemoccult: Not documented    Colonoscopy: Not documented  Other Screening   Pap smear: NEGATIVE FOR INTRAEPITHELIAL LESIONS OR MALIGNANCY.  (08/13/2009)    Mammogram: ASSESSMENT: Negative - BI-RADS 1^MM DIGITAL SCREENING  (09/24/2008)   Smoking status: quit  (10/30/2009)  Lipids   Total Cholesterol: 287  (08/13/2009)   LDL: *  mg/dL  (47/82/9562)   LDL Direct: Not documented   HDL: 44  (08/13/2009)   Triglycerides: 526  (08/13/2009)

## 2010-07-29 NOTE — Progress Notes (Signed)
Summary: refill/gg  Phone Note Refill Request  on December 30, 2009 10:48 AM  Refills Requested: Medication #1:  OXYBUTYNIN CHLORIDE 10 MG XR24H-TAB take 1 tablet by mouth daily.   Last Refilled: 11/19/2009  Method Requested: Electronic Initial call taken by: Merrie Roof RN,  December 30, 2009 10:49 AM    Prescriptions: OXYBUTYNIN CHLORIDE 10 MG XR24H-TAB (OXYBUTYNIN CHLORIDE) take 1 tablet by mouth daily.  #30 x 5   Entered and Authorized by:   Zoila Shutter MD   Signed by:   Zoila Shutter MD on 12/30/2009   Method used:   Electronically to        [facility]* (retail)       [address]       [place], [place]  [id number]       Ph: [phone]       Fax: [phone]   RxID:   [id number]
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
## 2010-07-29 NOTE — Assessment & Plan Note (Signed)
Summary: NEED A TB TEST./SB.  Nurse Visit   Allergies: No Known Drug Allergies  Immunizations Administered:  PPD Skin Test:    Vaccine Type: PPD    Site: right forearm    Mfr: Sanofi Pasteur    Dose: 0.1 ml    Route: ID    Given by: Merrie Roof RN    Exp. Date: 04/28/2011    Lot #: [id number]  PPD Results    Date of reading: 02/25/2010    Results: < 5mm    Interpretation: negative  Orders Added: 1)  TB Skin Test [86580] 2)  Admin 1st Vaccine [04540]

## 2010-07-29 NOTE — Progress Notes (Signed)
Summary: refill/gg  Phone Note Refill Request  on October 01, 2009 12:36 PM  Refills Requested: Medication #1:  HEMOCYTE 324 MG TABS Take 1 tablet by mouth once a day  Method Requested: Electronic Initial call taken by: Merrie Roof RN,  October 01, 2009 12:36 PM  Follow-up for Phone Call       Follow-up by: Jason Coop MD,  October 01, 2009 1:39 PM    Prescriptions: HEMOCYTE 324 MG TABS (FERROUS FUMARATE) Take 1 tablet by mouth once a day  #30 x 2   Entered and Authorized by:   Jason Coop MD   Signed by:   Jason Coop MD on 10/01/2009   Method used:   Electronically to        [facility]* (retail)       [address]       [place], [place]  [id number]       Ph: [phone]       Fax: [phone]   RxID:   [id number]

## 2010-07-29 NOTE — Letter (Signed)
Summary: [facility]: [facility]: [facility]   Imported By: Florinda Marker 08/13/2009 16:22:36  _____________________________________________________________________  External Attachment:    Type:   Image     Comment:   External Document

## 2010-08-01 ENCOUNTER — Other Ambulatory Visit: Payer: Self-pay | Admitting: Internal Medicine

## 2010-08-04 NOTE — Progress Notes (Signed)
Summary: Refill/gh  Phone Note Other Incoming   Caller: [facility] Summary of Call: Need an order to discontinue pt's Hydrocodone.  Pt no longer needs.  [phone].  Since it j=has not been continued pt is still getting and is out. Angelina Ok RN  July 27, 2010 3:38 PM   Initial call taken by: Angelina Ok RN,  July 27, 2010 3:37 PM

## 2010-09-07 ENCOUNTER — Encounter: Payer: Self-pay | Admitting: Internal Medicine

## 2010-09-16 ENCOUNTER — Encounter: Payer: Self-pay | Admitting: Internal Medicine

## 2010-09-16 ENCOUNTER — Ambulatory Visit (INDEPENDENT_AMBULATORY_CARE_PROVIDER_SITE_OTHER): Payer: Medicare Other | Admitting: Internal Medicine

## 2010-09-16 VITALS — BP 110/70 | HR 70 | Temp 97.2°F | Ht 62.0 in | Wt 212.8 lb

## 2010-09-16 DIAGNOSIS — F0390 Unspecified dementia without behavioral disturbance: Secondary | ICD-10-CM

## 2010-09-16 DIAGNOSIS — R42 Dizziness and giddiness: Secondary | ICD-10-CM

## 2010-09-16 DIAGNOSIS — F3110 Bipolar disorder, current episode manic without psychotic features, unspecified: Secondary | ICD-10-CM

## 2010-09-16 NOTE — Assessment & Plan Note (Signed)
The cause is unclear, may be due to meds side effects, orthostatic hypotension ,worsening dementia or supratherapeutic depakote level. She is walking fine and orthostatic vitals negative, so orthostatic hypotension is unlikely.  Will check CMET, depakote level to r/u other possibilities.
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
## 2010-09-16 NOTE — Assessment & Plan Note (Signed)
Has been taking aracept and needs assistance care in ALF.

## 2010-09-16 NOTE — Assessment & Plan Note (Signed)
Has been on depokote and seroquel and f/u by psychiatrist. Will continue current meds for now.

## 2010-09-16 NOTE — Patient Instructions (Signed)
Please take all your medications as instructed in your instructions.   We will call you if any abnormal lab results.      

## 2010-09-17 LAB — COMPREHENSIVE METABOLIC PANEL
AST: 13 U/L (ref 0–37)
Alkaline Phosphatase: 74 U/L (ref 39–117)
BUN: 26 mg/dL — ABNORMAL HIGH (ref 6–23)
Creat: 1.02 mg/dL (ref 0.40–1.20)
Glucose, Bld: 86 mg/dL (ref 70–99)

## 2010-09-24 ENCOUNTER — Other Ambulatory Visit: Payer: Self-pay | Admitting: Internal Medicine

## 2010-09-24 DIAGNOSIS — G894 Chronic pain syndrome: Secondary | ICD-10-CM

## 2011-02-04 ENCOUNTER — Other Ambulatory Visit: Payer: Self-pay | Admitting: *Deleted

## 2011-02-04 DIAGNOSIS — E785 Hyperlipidemia, unspecified: Secondary | ICD-10-CM

## 2011-02-04 MED ORDER — PRAVASTATIN SODIUM 40 MG PO TABS: 40.0000 mg | ORAL_TABLET | Freq: Every day | ORAL | Status: DC

## 2011-02-06 ENCOUNTER — Other Ambulatory Visit: Payer: Self-pay | Admitting: Internal Medicine

## 2011-03-23 ENCOUNTER — Other Ambulatory Visit: Payer: Self-pay | Admitting: *Deleted

## 2011-03-25 MED ORDER — DONEPEZIL HCL 10 MG PO TABS: 10.0000 mg | ORAL_TABLET | Freq: Every day | ORAL | Status: DC

## 2011-06-08 ENCOUNTER — Other Ambulatory Visit: Payer: Self-pay | Admitting: *Deleted

## 2011-06-10 MED ORDER — FERROCITE PLUS 106-1 MG PO TABS: ORAL_TABLET | ORAL | Status: DC

## 2011-07-22 ENCOUNTER — Other Ambulatory Visit: Payer: Self-pay | Admitting: *Deleted

## 2011-07-22 MED ORDER — DIVALPROEX SODIUM 500 MG PO DR TAB: 500.0000 mg | DELAYED_RELEASE_TABLET | Freq: Every day | ORAL | Status: DC

## 2011-07-22 MED ORDER — DIVALPROEX SODIUM 250 MG PO DR TAB: 250.0000 mg | DELAYED_RELEASE_TABLET | Freq: Every day | ORAL | Status: DC

## 2011-07-22 MED ORDER — DONEPEZIL HCL 10 MG PO TABS: 10.0000 mg | ORAL_TABLET | Freq: Every day | ORAL | Status: DC

## 2011-07-22 MED ORDER — QUETIAPINE FUMARATE 400 MG PO TABS: 400.0000 mg | ORAL_TABLET | Freq: Every day | ORAL | Status: DC

## 2011-07-22 NOTE — Telephone Encounter (Signed)
Pt's care provider came to clinic asking for refill on above meds.  She states she has been trying for a week. Pt used to get these meds from mental health, [facility] but [facility] sent to [facility] and they are now out of business. We are her last resort.  They just found out yesterday and want enough to last until they get in with another mental health clinic. She is out of all these meds.

## 2011-07-22 NOTE — Telephone Encounter (Signed)
Refilled for wto months so that pt has time to get into mental health. They will take over Rxing then

## 2011-08-01 ENCOUNTER — Ambulatory Visit (INDEPENDENT_AMBULATORY_CARE_PROVIDER_SITE_OTHER): Payer: Medicare Other | Admitting: Internal Medicine

## 2011-08-01 ENCOUNTER — Encounter: Payer: Self-pay | Admitting: Internal Medicine

## 2011-08-01 VITALS — BP 147/84 | HR 65 | Temp 97.8°F | Ht 62.0 in | Wt 214.6 lb

## 2011-08-01 DIAGNOSIS — F3110 Bipolar disorder, current episode manic without psychotic features, unspecified: Secondary | ICD-10-CM

## 2011-08-01 DIAGNOSIS — E785 Hyperlipidemia, unspecified: Secondary | ICD-10-CM

## 2011-08-01 DIAGNOSIS — F0390 Unspecified dementia without behavioral disturbance: Secondary | ICD-10-CM

## 2011-08-01 DIAGNOSIS — Z23 Encounter for immunization: Secondary | ICD-10-CM

## 2011-08-01 DIAGNOSIS — Z0000 Encounter for general adult medical examination without abnormal findings: Secondary | ICD-10-CM

## 2011-08-01 NOTE — Patient Instructions (Signed)
Please schedule a follow up appointment in 3 months. Please take your medicines as prescribed. Please bring your medication bottles with next appointment.

## 2011-08-02 DIAGNOSIS — Z0000 Encounter for general adult medical examination without abnormal findings: Secondary | ICD-10-CM | POA: Insufficient documentation

## 2011-08-02 LAB — CBC WITH DIFFERENTIAL/PLATELET
Basophils Absolute: 0 10*3/uL (ref 0.0–0.1)
Eosinophils Relative: 1 % (ref 0–5)
HCT: 42.2 % (ref 36.0–46.0)
Lymphocytes Relative: 33 % (ref 12–46)
MCHC: 31 g/dL (ref 30.0–36.0)
MCV: 92.3 fL (ref 78.0–100.0)
Monocytes Relative: 7 % (ref 3–12)
Neutro Abs: 3.8 10*3/uL (ref 1.7–7.7)
Platelets: 222 10*3/uL (ref 150–400)
RDW: 14.6 % (ref 11.5–15.5)
WBC: 6.5 10*3/uL (ref 4.0–10.5)

## 2011-08-02 LAB — LIPID PANEL
Cholesterol: 248 mg/dL — ABNORMAL HIGH (ref 0–200)
LDL Cholesterol: 135 mg/dL — ABNORMAL HIGH (ref 0–99)
Total CHOL/HDL Ratio: 5.8 Ratio
Triglycerides: 352 mg/dL — ABNORMAL HIGH (ref <150)
VLDL: 70 mg/dL — ABNORMAL HIGH (ref 0–40)

## 2011-08-02 LAB — COMPLETE METABOLIC PANEL WITH GFR
ALT: 10 U/L (ref 0–35)
Albumin: 4.3 g/dL (ref 3.5–5.2)
Alkaline Phosphatase: 68 U/L (ref 39–117)
BUN: 24 mg/dL — ABNORMAL HIGH (ref 6–23)
CO2: 24 mEq/L (ref 19–32)
Calcium: 9.9 mg/dL (ref 8.4–10.5)
Chloride: 104 mEq/L (ref 96–112)
GFR, Est Non African American: 72 mL/min
Glucose, Bld: 86 mg/dL (ref 70–99)
Potassium: 4.3 mEq/L (ref 3.5–5.3)
Total Protein: 6.7 g/dL (ref 6.0–8.3)

## 2011-08-02 MED ORDER — FERROCITE PLUS 106-1 MG PO TABS: ORAL_TABLET | ORAL | Status: DC

## 2011-08-02 MED ORDER — DIVALPROEX SODIUM 500 MG PO DR TAB: 500.0000 mg | DELAYED_RELEASE_TABLET | Freq: Every day | ORAL | Status: DC

## 2011-08-02 MED ORDER — FLUOXETINE HCL 20 MG PO CAPS: ORAL_CAPSULE | ORAL | Status: DC

## 2011-08-02 MED ORDER — CLONAZEPAM 0.5 MG PO TABS: 0.5000 mg | ORAL_TABLET | Freq: Every day | ORAL | Status: DC

## 2011-08-02 MED ORDER — PRAVASTATIN SODIUM 40 MG PO TABS: 40.0000 mg | ORAL_TABLET | Freq: Every day | ORAL | Status: DC

## 2011-08-02 MED ORDER — OXYBUTYNIN CHLORIDE ER 10 MG PO TB24: 10.0000 mg | ORAL_TABLET | Freq: Every day | ORAL | Status: DC

## 2011-08-02 MED ORDER — DIVALPROEX SODIUM 250 MG PO DR TAB: 250.0000 mg | DELAYED_RELEASE_TABLET | Freq: Every day | ORAL | Status: DC

## 2011-08-02 MED ORDER — QUETIAPINE FUMARATE 400 MG PO TABS: 400.0000 mg | ORAL_TABLET | Freq: Every day | ORAL | Status: DC

## 2011-08-02 MED ORDER — FERROUS FUMARATE 325 (106 FE) MG PO TABS: 1.0000 | ORAL_TABLET | Freq: Every day | ORAL | Status: DC

## 2011-08-02 MED ORDER — DONEPEZIL HCL 10 MG PO TABS: 10.0000 mg | ORAL_TABLET | Freq: Every day | ORAL | Status: DC

## 2011-08-02 NOTE — Assessment & Plan Note (Signed)
Currently stable as per the caregiver. The old practice has  closed and they're trying to recruit a new psychiatrist .Continue Seroquel, Prozac, Depakote.  - Check depakote levels today

## 2011-08-02 NOTE — Progress Notes (Signed)
  Subjective:    Patient ID: Kelly Chandler, female    DOB: 02-25-1956, 56 y.o.   MRN: [id number]  HPI: 56 year old woman resident of assisted living facility ,with past medical history significant for traumatic brain injury, bipolar disorder, dementia comes to the clinic for a followup visit. Patient is a very poor historian from her dementia and underlying psychiatric illness She was accompanied by a caregiver.  Patient is here for her annual checkup and denied any complaints including chest pain, SOB, fever, alteration in bowel or bladder habits The caregiver agreed to the patient.  Her here for medication refill. The caregiver states that patient does not have a psychiatrist at this point of time ( are trying to get appointment for a new one) and  requesting refills for those as well.    Review of Systems  Constitutional: Negative for chills, diaphoresis, appetite change and fatigue.  HENT: Negative for congestion, rhinorrhea and postnasal drip.   Respiratory: Negative for cough, shortness of breath, wheezing and stridor.   Cardiovascular: Negative for chest pain, palpitations and leg swelling.  Gastrointestinal: Negative for abdominal pain.  Genitourinary: Negative for dysuria, frequency and flank pain.  Musculoskeletal: Negative for arthralgias.  Neurological: Negative for dizziness, facial asymmetry, light-headedness, numbness and headaches.  Hematological: Negative for adenopathy.       Objective:   Physical Exam  Constitutional: She appears well-developed and well-nourished. No distress.  HENT:  Head: Normocephalic and atraumatic.  Mouth/Throat: No oropharyngeal exudate.  Eyes: Conjunctivae and EOM are normal. Pupils are equal, round, and reactive to light.  Neck: Normal range of motion. Neck supple. No JVD present. No tracheal deviation present. No thyromegaly present.  Cardiovascular: Normal rate, regular rhythm, normal heart sounds and intact distal pulses.  Exam reveals  no gallop and no friction rub.   No murmur heard. Pulmonary/Chest: Effort normal and breath sounds normal. No stridor. No respiratory distress. She has no wheezes. She has no rales.  Abdominal: Soft. Bowel sounds are normal. She exhibits no distension. There is no tenderness. There is no rebound.  Musculoskeletal: Normal range of motion. She exhibits no edema and no tenderness.  Lymphadenopathy:    She has no cervical adenopathy.  Skin: Skin is warm. She is not diaphoretic.          Assessment & Plan:

## 2011-08-02 NOTE — Assessment & Plan Note (Signed)
-   She got her flu shot today. - Check routine labs.

## 2011-08-02 NOTE — Assessment & Plan Note (Signed)
Check lipid panel and CMET. 

## 2011-08-02 NOTE — Assessment & Plan Note (Signed)
Living in a group home for assistance. Continue Aricept.

## 2011-09-19 ENCOUNTER — Other Ambulatory Visit: Payer: Self-pay | Admitting: *Deleted

## 2011-09-19 MED ORDER — DIVALPROEX SODIUM 500 MG PO DR TAB: 500.0000 mg | DELAYED_RELEASE_TABLET | Freq: Every day | ORAL | Status: DC

## 2011-09-19 MED ORDER — DONEPEZIL HCL 10 MG PO TABS: 10.0000 mg | ORAL_TABLET | Freq: Every day | ORAL | Status: DC

## 2011-09-19 MED ORDER — DIVALPROEX SODIUM 250 MG PO DR TAB: 250.0000 mg | DELAYED_RELEASE_TABLET | Freq: Every day | ORAL | Status: DC

## 2011-11-01 ENCOUNTER — Other Ambulatory Visit: Payer: Self-pay | Admitting: *Deleted

## 2011-11-01 MED ORDER — QUETIAPINE FUMARATE 400 MG PO TABS: 400.0000 mg | ORAL_TABLET | Freq: Every day | ORAL | Status: DC

## 2011-11-02 ENCOUNTER — Other Ambulatory Visit: Payer: Self-pay | Admitting: *Deleted

## 2011-11-02 DIAGNOSIS — F319 Bipolar disorder, unspecified: Secondary | ICD-10-CM

## 2011-11-02 MED ORDER — FLUOXETINE HCL 20 MG PO CAPS: 60.0000 mg | ORAL_CAPSULE | Freq: Every day | ORAL | Status: DC

## 2011-11-23 ENCOUNTER — Other Ambulatory Visit: Payer: Self-pay | Admitting: *Deleted

## 2011-11-23 ENCOUNTER — Ambulatory Visit: Payer: Medicare Other | Admitting: Internal Medicine

## 2011-11-23 ENCOUNTER — Encounter: Payer: Medicare Other | Admitting: Internal Medicine

## 2011-11-23 MED ORDER — OXYBUTYNIN CHLORIDE ER 10 MG PO TB24: 10.0000 mg | ORAL_TABLET | Freq: Every day | ORAL | Status: DC

## 2011-11-25 ENCOUNTER — Encounter: Payer: Self-pay | Admitting: Internal Medicine

## 2011-11-25 ENCOUNTER — Ambulatory Visit (INDEPENDENT_AMBULATORY_CARE_PROVIDER_SITE_OTHER): Payer: Medicare Other | Admitting: Internal Medicine

## 2011-11-25 VITALS — BP 114/72 | HR 84 | Temp 98.4°F | Ht 62.0 in | Wt 212.0 lb

## 2011-11-25 DIAGNOSIS — N390 Urinary tract infection, site not specified: Secondary | ICD-10-CM

## 2011-11-25 DIAGNOSIS — R32 Unspecified urinary incontinence: Secondary | ICD-10-CM

## 2011-11-25 LAB — POCT URINALYSIS DIPSTICK
Glucose, UA: NEGATIVE
Nitrite, UA: NEGATIVE
Urobilinogen, UA: 0.2

## 2011-11-25 MED ORDER — CIPROFLOXACIN HCL 500 MG PO TABS: 500.0000 mg | ORAL_TABLET | Freq: Two times a day (BID) | ORAL | Status: AC

## 2011-11-25 NOTE — Progress Notes (Signed)
Addended by: Army Fossa on: 11/25/2011 11:23 AM   Modules accepted: Orders

## 2011-11-25 NOTE — Patient Instructions (Signed)
Please take ciprofloxacin twice daily for 3 days.  Please contact the clinic if you notice any new side effects to medication.

## 2011-11-25 NOTE — Progress Notes (Signed)
Subjective:     Patient ID: Kelly Chandler, female   DOB: [birth year], 56 y.o.   MRN: [id number]  HPI Kelly Chandler is a 56 year old woman with pmh significant for HLD, Bipolar d/o, and dementia who presents with [name] her caregiver from group home. Elon Jester notes that patient had an episode of urinary incontinence that happened this past Monday. Urine is cloudy and with foul odor. Patient also complains of burning on urination, but denies vaginal discharge. No vaginal itching noted or observed.   Medications are administered by [name]. No changes in mental status noted. Otherwise no other complaints.   Review of Systems  Constitutional: Negative for fever, chills and appetite change.  Cardiovascular: Negative for chest pain.  Gastrointestinal: Negative for nausea, vomiting, abdominal pain, diarrhea and abdominal distention.  Genitourinary: Positive for dysuria. Negative for urgency, frequency and flank pain.       Objective:   Physical Exam  Vitals reviewed. Constitutional: She appears well-developed.  HENT:  Head: Normocephalic.  Eyes: Pupils are equal, round, and reactive to light.  Neck: Normal range of motion.  Cardiovascular: Normal rate and regular rhythm.   Pulmonary/Chest: Effort normal and breath sounds normal.  Abdominal: Soft. Bowel sounds are normal.       Suprapubic tenderness  Musculoskeletal: Normal range of motion.  Neurological:       nonfocal  Psychiatric: She has a normal mood and affect.

## 2011-11-26 LAB — URINALYSIS, ROUTINE W REFLEX MICROSCOPIC
Glucose, UA: NEGATIVE mg/dL
Hgb urine dipstick: NEGATIVE

## 2011-11-26 LAB — URINALYSIS, MICROSCOPIC ONLY
Casts: NONE SEEN
Crystals: NONE SEEN

## 2011-12-12 ENCOUNTER — Other Ambulatory Visit: Payer: Self-pay | Admitting: *Deleted

## 2011-12-12 NOTE — Telephone Encounter (Signed)
Needs a refill on Ciprofloxacin 500mg  # 6  - written 11/25/11 and filled 11/26/11.

## 2011-12-13 NOTE — Telephone Encounter (Signed)
I did speak to her care giver Rivka Barbara) who told me that the patient is doing much better- decreased odor to urine and improved incontinence.  I would not refill her cipro. As per the care giver request , order for discontinuation was faxed over to her by [facility] -[phone]. .   Thanks,  Amine Adelson

## 2011-12-26 ENCOUNTER — Other Ambulatory Visit: Payer: Self-pay | Admitting: *Deleted

## 2011-12-27 MED ORDER — DONEPEZIL HCL 10 MG PO TABS: 10.0000 mg | ORAL_TABLET | Freq: Every day | ORAL | Status: DC

## 2011-12-28 ENCOUNTER — Other Ambulatory Visit: Payer: Self-pay | Admitting: *Deleted

## 2011-12-28 MED ORDER — DONEPEZIL HCL 10 MG PO TABS: 10.0000 mg | ORAL_TABLET | Freq: Every day | ORAL | Status: DC

## 2012-01-16 ENCOUNTER — Ambulatory Visit (INDEPENDENT_AMBULATORY_CARE_PROVIDER_SITE_OTHER): Payer: Medicare Other | Admitting: Internal Medicine

## 2012-01-16 ENCOUNTER — Encounter: Payer: Self-pay | Admitting: Internal Medicine

## 2012-01-16 VITALS — BP 115/69 | HR 79 | Temp 97.5°F | Ht 62.0 in | Wt 214.9 lb

## 2012-01-16 DIAGNOSIS — N390 Urinary tract infection, site not specified: Secondary | ICD-10-CM

## 2012-01-16 DIAGNOSIS — F0390 Unspecified dementia without behavioral disturbance: Secondary | ICD-10-CM

## 2012-01-20 NOTE — Progress Notes (Signed)
  Subjective:    Patient ID: Kelly Chandler, female    DOB: 1956-06-05, 56 y.o.   MRN: [id number]  HPI Kelly Chandler is a 56 year old woman with past medical history significant for traumatic brain injury and bipolar disorder who comes in today accompanied by her group home staff primarily for FMLA forms and follow up of remote UTI. She was last seen in clinic on Nov 25, 2011 for dysuria and subsequently diagnosed UTI. She was treated with ciprofloxacin BID for three days and her symptoms completely resolved.   In the past, her FMLA forms have been dropped off for review by our CSW but her group home guardian reports concern for the forms getting lost and would like them to be reviewed during this visit.   Kelly Chandler had no other complaints today.   Review of Systems  Constitutional: Negative for fever, appetite change and unexpected weight change.  Respiratory: Negative for cough and shortness of breath.   Cardiovascular: Negative for chest pain.  Genitourinary: Negative for dysuria, urgency, frequency and difficulty urinating.  Musculoskeletal: Negative for back pain.       Objective:   Physical Exam  Constitutional: She appears well-nourished. No distress.       Conversant  Eyes: Conjunctivae are normal.  Pulmonary/Chest: Effort normal. No respiratory distress. She has no wheezes.  Neurological: She is alert.  Skin: Skin is warm and dry. She is not diaphoretic.          Assessment & Plan:

## 2012-01-20 NOTE — Progress Notes (Signed)
Agree with plan 

## 2012-01-20 NOTE — Assessment & Plan Note (Signed)
Pt was treated on 5/13 with cipro BID x 3days, reports complete resolution of symptoms

## 2012-01-20 NOTE — Assessment & Plan Note (Signed)
Pt accompanied by group home staff who requests FMLA forms to be reviewed and filled out today. Advised [facility] staff to bring paperwork for CSW review next time and follow up appointment with PCP. Filled out FMLA forms during this visit.

## 2012-01-30 ENCOUNTER — Other Ambulatory Visit: Payer: Self-pay | Admitting: *Deleted

## 2012-01-30 MED ORDER — OXYBUTYNIN CHLORIDE ER 10 MG PO TB24: 10.0000 mg | ORAL_TABLET | Freq: Every day | ORAL | Status: DC

## 2012-03-19 ENCOUNTER — Other Ambulatory Visit: Payer: Self-pay | Admitting: *Deleted

## 2012-03-19 DIAGNOSIS — D649 Anemia, unspecified: Secondary | ICD-10-CM

## 2012-03-19 DIAGNOSIS — E785 Hyperlipidemia, unspecified: Secondary | ICD-10-CM

## 2012-03-19 MED ORDER — PRAVASTATIN SODIUM 40 MG PO TABS: 40.0000 mg | ORAL_TABLET | Freq: Every day | ORAL | Status: DC

## 2012-03-19 MED ORDER — FERROUS FUMARATE 325 (106 FE) MG PO TABS: 1.0000 | ORAL_TABLET | Freq: Every day | ORAL | Status: DC

## 2012-03-27 ENCOUNTER — Encounter: Payer: Self-pay | Admitting: Internal Medicine

## 2012-03-27 ENCOUNTER — Ambulatory Visit (INDEPENDENT_AMBULATORY_CARE_PROVIDER_SITE_OTHER): Payer: Medicare Other | Admitting: Internal Medicine

## 2012-03-27 VITALS — BP 128/89 | HR 85 | Temp 96.8°F | Wt 210.5 lb

## 2012-03-27 DIAGNOSIS — Z23 Encounter for immunization: Secondary | ICD-10-CM

## 2012-03-27 DIAGNOSIS — Z299 Encounter for prophylactic measures, unspecified: Secondary | ICD-10-CM

## 2012-03-27 DIAGNOSIS — R32 Unspecified urinary incontinence: Secondary | ICD-10-CM

## 2012-03-27 DIAGNOSIS — R82998 Other abnormal findings in urine: Secondary | ICD-10-CM

## 2012-03-27 DIAGNOSIS — R8290 Unspecified abnormal findings in urine: Secondary | ICD-10-CM
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
## 2012-03-27 NOTE — Assessment & Plan Note (Addendum)
Both urinary and fecal.  May be related to UTI given malodorous urine.  Regarding fecal incontinence, rectal tone intact and FOBT (-).  Daily BM so constipation not a cause. May be just clinical deterioration.  No regular neurologist on board.    -UA/micro - will call with results.  ADDENDUM: UA/micro reviewed.  Will treat with Nitrofurantoin 100 BID x 7 days.  In July she was treated with Cipro x 3 days.  If patient has recurrent UTI this year, send urine for culture.  Left message for Gerrianne Scale (caregiver) and also left letter at front desk as per request yesterday.

## 2012-03-27 NOTE — Patient Instructions (Addendum)
I will call you when I get the results of your urine studies.  Please be sure to bring all of your medications with you to every visit.  Should you have any new or worsening symptoms, please be sure to call the clinic at [phone].

## 2012-03-28 LAB — URINALYSIS, ROUTINE W REFLEX MICROSCOPIC
Glucose, UA: NEGATIVE mg/dL
Hgb urine dipstick: NEGATIVE
Ketones, ur: NEGATIVE mg/dL
pH: 6 (ref 5.0–8.0)

## 2012-03-28 LAB — URINALYSIS, MICROSCOPIC ONLY: Casts: NONE SEEN

## 2012-03-28 MED ORDER — NITROFURANTOIN MONOHYD MACRO 100 MG PO CAPS: 100.0000 mg | ORAL_CAPSULE | Freq: Two times a day (BID) | ORAL | Status: DC

## 2012-03-28 NOTE — Addendum Note (Signed)
Addended by: Belia Heman on: 03/28/2012 10:06 AM   Modules accepted: Orders

## 2012-03-29 ENCOUNTER — Other Ambulatory Visit: Payer: Self-pay | Admitting: *Deleted

## 2012-03-29 MED ORDER — OXYBUTYNIN CHLORIDE ER 10 MG PO TB24: 10.0000 mg | ORAL_TABLET | Freq: Every day | ORAL | Status: DC

## 2012-04-02 ENCOUNTER — Telehealth: Payer: Self-pay | Admitting: *Deleted

## 2012-04-02 NOTE — Telephone Encounter (Signed)
A cna from [facility] calls and states she rec'd a call from the group home that pt lives in stating that pt "looks like she had a stroke" i ask how pt looked she stated she didn't know because she hasn't seen her and she just wants an appt for the pt to "have her checked out", i explained that if the pt had a stroke today she needs to go to ED for eval and be provided with the appropriate medical care. 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 i ask her to call me from the group home upon arrival and i would advise her at that point and time but there should not be a lot of time used to make a decision. i will await her call.

## 2012-04-02 NOTE — Telephone Encounter (Signed)
cna calls and states she has seen pt at this time and pt is her normal self, she states pt communicated normally with her, moved as normal face does not look any different than normal, gait is no different than normal, she states that if anything does change [facility] will take her to ED

## 2012-04-02 NOTE — Telephone Encounter (Signed)
Thanks

## 2012-04-02 NOTE — Telephone Encounter (Signed)
This patient needs to be sent to the emergency department via EMS.

## 2012-05-21 ENCOUNTER — Other Ambulatory Visit: Payer: Self-pay | Admitting: *Deleted

## 2012-05-21 DIAGNOSIS — F319 Bipolar disorder, unspecified: Secondary | ICD-10-CM

## 2012-05-22 MED ORDER — FLUOXETINE HCL 20 MG PO CAPS: 60.0000 mg | ORAL_CAPSULE | Freq: Every day | ORAL | Status: AC

## 2012-05-31 ENCOUNTER — Other Ambulatory Visit: Payer: Self-pay | Admitting: *Deleted

## 2012-05-31 DIAGNOSIS — R32 Unspecified urinary incontinence: Secondary | ICD-10-CM

## 2012-05-31 MED ORDER — OXYBUTYNIN CHLORIDE ER 10 MG PO TB24: 10.0000 mg | ORAL_TABLET | Freq: Every day | ORAL | Status: DC

## 2012-08-08 ENCOUNTER — Ambulatory Visit: Payer: Medicare Other | Admitting: Internal Medicine

## 2012-08-17 ENCOUNTER — Ambulatory Visit (INDEPENDENT_AMBULATORY_CARE_PROVIDER_SITE_OTHER): Payer: Medicare Other | Admitting: Internal Medicine

## 2012-08-17 ENCOUNTER — Encounter: Payer: Self-pay | Admitting: Internal Medicine

## 2012-08-17 VITALS — BP 119/70 | HR 77 | Temp 98.1°F | Ht 62.5 in | Wt 213.1 lb

## 2012-08-17 DIAGNOSIS — Z0000 Encounter for general adult medical examination without abnormal findings: Secondary | ICD-10-CM

## 2012-08-17 DIAGNOSIS — Z79899 Other long term (current) drug therapy: Secondary | ICD-10-CM

## 2012-08-17 DIAGNOSIS — E785 Hyperlipidemia, unspecified: Secondary | ICD-10-CM

## 2012-08-17 DIAGNOSIS — R32 Unspecified urinary incontinence: Secondary | ICD-10-CM

## 2012-08-17 DIAGNOSIS — F3110 Bipolar disorder, current episode manic without psychotic features, unspecified: Secondary | ICD-10-CM

## 2012-08-17 DIAGNOSIS — F319 Bipolar disorder, unspecified: Secondary | ICD-10-CM

## 2012-08-17 LAB — LIPID PANEL: Total CHOL/HDL Ratio: 4.7 Ratio

## 2012-08-17 LAB — CBC
Hemoglobin: 12.5 g/dL (ref 12.0–15.0)
RBC: 4.26 MIL/uL (ref 3.87–5.11)
WBC: 6.8 10*3/uL (ref 4.0–10.5)

## 2012-08-17 LAB — POCT GLYCOSYLATED HEMOGLOBIN (HGB A1C): Hemoglobin A1C: 5.4

## 2012-08-17 MED ORDER — DIAPERS & SUPPLIES MISC: 1.0000 | Freq: Every day | Status: DC

## 2012-08-17 MED ORDER — DIAPERS & SUPPLIES MISC: 1.0000 | Freq: Two times a day (BID) | Status: DC

## 2012-08-17 NOTE — Assessment & Plan Note (Signed)
Caregiver reports some urinary incontinence but denies any odor to urine or patient complaining of dysuria or abdominal pain. She was requesting prescription for diapers which was given.

## 2012-08-17 NOTE — Assessment & Plan Note (Signed)
Patient follows up with the mental health. She has been taking Prozac Seroquel , Depakote and Klonopin - Check depakote levels.

## 2012-08-17 NOTE — Assessment & Plan Note (Signed)
She reports that patient had a mammogram in 2013. She was advised to drop the report to our clinic. Pap smear with next visit. Routine labs.  She had forms for a new referral to mental health that were filled up.

## 2012-08-17 NOTE — Assessment & Plan Note (Signed)
Check lipid panel and CMET. 

## 2012-08-17 NOTE — Patient Instructions (Addendum)
General Instructions: Please schedule a follow up appointment in 6 months . Please bring your medication bottles with your next appointment. Please take your medicines as prescribed. I will call you with your lab results if anything will be abnormal.    Treatment Goals:  Goals (1 Years of Data) as of 08/17/12   None
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
## 2012-08-18 LAB — COMPLETE METABOLIC PANEL WITH GFR
ALT: 12 U/L (ref 0–35)
Albumin: 3.6 g/dL (ref 3.5–5.2)
CO2: 29 mEq/L (ref 19–32)
GFR, Est African American: 72 mL/min
GFR, Est Non African American: 62 mL/min
Glucose, Bld: 96 mg/dL (ref 70–99)
Potassium: 4 mEq/L (ref 3.5–5.3)
Sodium: 142 mEq/L (ref 135–145)
Total Protein: 6.2 g/dL (ref 6.0–8.3)

## 2012-08-18 LAB — VALPROIC ACID LEVEL: Valproic Acid Lvl: 83.8 ug/mL (ref 50.0–100.0)

## 2012-08-21 NOTE — Addendum Note (Signed)
Addended by: Elyse Jarvis on: 08/21/2012 10:54 AM   Modules accepted: Orders

## 2012-09-18 ENCOUNTER — Other Ambulatory Visit: Payer: Self-pay | Admitting: Internal Medicine

## 2012-09-18 DIAGNOSIS — Z1231 Encounter for screening mammogram for malignant neoplasm of breast: Secondary | ICD-10-CM

## 2012-09-21 ENCOUNTER — Ambulatory Visit (HOSPITAL_COMMUNITY)
Admission: RE | Admit: 2012-09-21 | Discharge: 2012-09-21 | Disposition: A | Discharge status: Home / Self Care | Payer: Medicare Other | Source: Ambulatory Visit | Attending: Internal Medicine | Admitting: Internal Medicine

## 2012-09-21 DIAGNOSIS — Z1231 Encounter for screening mammogram for malignant neoplasm of breast: Secondary | ICD-10-CM | POA: Insufficient documentation

## 2012-10-17 ENCOUNTER — Other Ambulatory Visit: Payer: Self-pay | Admitting: *Deleted

## 2012-10-17 DIAGNOSIS — R32 Unspecified urinary incontinence: Secondary | ICD-10-CM

## 2012-10-19 MED ORDER — OXYBUTYNIN CHLORIDE ER 10 MG PO TB24: 10.0000 mg | ORAL_TABLET | Freq: Every day | ORAL | Status: DC

## 2012-12-11 ENCOUNTER — Other Ambulatory Visit (HOSPITAL_COMMUNITY)
Admission: RE | Admit: 2012-12-11 | Discharge: 2012-12-11 | Disposition: A | Discharge status: Home / Self Care | Payer: Medicare Other | Source: Ambulatory Visit | Attending: Internal Medicine | Admitting: Internal Medicine

## 2012-12-11 ENCOUNTER — Ambulatory Visit (INDEPENDENT_AMBULATORY_CARE_PROVIDER_SITE_OTHER): Payer: Medicare Other | Admitting: Internal Medicine

## 2012-12-11 ENCOUNTER — Encounter: Payer: Self-pay | Admitting: Internal Medicine

## 2012-12-11 VITALS — BP 115/74 | HR 72 | Temp 98.3°F | Ht 62.0 in | Wt 205.2 lb

## 2012-12-11 DIAGNOSIS — N898 Other specified noninflammatory disorders of vagina: Secondary | ICD-10-CM

## 2012-12-11 DIAGNOSIS — N760 Acute vaginitis: Secondary | ICD-10-CM | POA: Insufficient documentation

## 2012-12-11 DIAGNOSIS — Z01419 Encounter for gynecological examination (general) (routine) without abnormal findings: Secondary | ICD-10-CM | POA: Insufficient documentation

## 2012-12-11 DIAGNOSIS — Z113 Encounter for screening for infections with a predominantly sexual mode of transmission: Secondary | ICD-10-CM | POA: Insufficient documentation

## 2012-12-11 DIAGNOSIS — Z79899 Other long term (current) drug therapy: Secondary | ICD-10-CM | POA: Insufficient documentation

## 2012-12-11 DIAGNOSIS — Z1151 Encounter for screening for human papillomavirus (HPV): Secondary | ICD-10-CM | POA: Insufficient documentation

## 2012-12-11 DIAGNOSIS — N949 Unspecified condition associated with female genital organs and menstrual cycle: Secondary | ICD-10-CM

## 2012-12-11 LAB — CBC WITH DIFFERENTIAL/PLATELET
Basophils Absolute: 0 10*3/uL (ref 0.0–0.1)
Basophils Relative: 0 % (ref 0–1)
Eosinophils Absolute: 0 10*3/uL (ref 0.0–0.7)
Eosinophils Relative: 1 % (ref 0–5)
HCT: 40.9 % (ref 36.0–46.0)
Hemoglobin: 13.5 g/dL (ref 12.0–15.0)
Lymphocytes Relative: 32 % (ref 12–46)
MCHC: 33 g/dL (ref 30.0–36.0)
MCV: 88.7 fL (ref 78.0–100.0)
Monocytes Absolute: 0.6 10*3/uL (ref 0.1–1.0)
Monocytes Relative: 10 % (ref 3–12)
Neutro Abs: 3.1 10*3/uL (ref 1.7–7.7)
Neutrophils Relative %: 57 % (ref 43–77)
Platelets: 191 10*3/uL (ref 150–400)
RDW: 14.5 % (ref 11.5–15.5)
WBC: 5.5 10*3/uL (ref 4.0–10.5)

## 2012-12-11 NOTE — Assessment & Plan Note (Signed)
Routine monitoring as patient is on quetiapine and valproic acid: CBC with differential, hemoglobin A1c, lipid panel, liver function tests, Depakote level Results will be faxed to her facility

## 2012-12-11 NOTE — Assessment & Plan Note (Addendum)
Not clear if patient is having any symptoms. Unclear chronicity of odor, noticed for at least the past week by caregivers. Is incontinent of urine, which may be contributing to odor. Pelvic exam performed with scattered erythematous papules of her vaginal mucosa, no tenderness, no significant discharge. - Pap smear - Gonorrhea/chlamydia and wet prep - Urinalysis  ADDENDUM 12/13/2012 1:58 PM Wet prep + for gardnerella. Sent Rx for metronidazole 500mg  BID x7 days to pharmacy. Called and LM w care provider [name] notifying her of results and prescription.

## 2012-12-11 NOTE — Patient Instructions (Addendum)
We will fax your labs to you. I will call if anything is abnormal.    Treatment Goals:  Goals (1 Years of Data) as of 12/11/12         As of Today 08/17/12 03/27/12 01/16/12 11/25/11     Blood Pressure    . Blood Pressure < 140/90  115/74 119/70 128/89 115/69 114/72     Lifestyle    . Prevent Falls            Progress Toward Treatment Goals:  Treatment Goal 12/11/2012  Prevent falls unable to assess    Self Care Goals & Plans:  Self Care Goal 12/11/2012  Manage my medications take my medicines as prescribed; bring my medications to every visit; refill my medications on time  Eat healthy foods drink diet soda or water instead of juice or soda; eat more vegetables; eat foods that are low in salt; eat baked foods instead of fried foods       Care Management & Community Referrals:
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
## 2012-12-12 LAB — LIPID PANEL
Cholesterol: 213 mg/dL — ABNORMAL HIGH (ref 0–200)
HDL: 45 mg/dL (ref >39)
LDL Cholesterol: 98 mg/dL (ref 0–99)
Triglycerides: 352 mg/dL — ABNORMAL HIGH (ref <150)

## 2012-12-12 LAB — HEPATIC FUNCTION PANEL
ALT: 12 U/L (ref 0–35)
AST: 14 U/L (ref 0–37)
Bilirubin, Direct: <0.1 mg/dL (ref 0.0–0.3)
Total Bilirubin: 0.3 mg/dL (ref 0.3–1.2)
Total Protein: 6.5 g/dL (ref 6.0–8.3)

## 2012-12-12 NOTE — Progress Notes (Signed)
Results of labs faxed to [facility] Care by [place] on 12/12/12 at 9:15am.

## 2012-12-12 NOTE — Progress Notes (Signed)
Case discussed with Dr. Heloise Beecham immediately after the resident saw the patient. We reviewed the resident's history and exam and pertinent patient test results. I agree with the assessment, diagnosis and plan of care documented in the resident's note.  We will await results of lab tests prior to treating for UTI or STD as indicated.

## 2012-12-13 MED ORDER — METRONIDAZOLE 500 MG PO TABS: 500.0000 mg | ORAL_TABLET | Freq: Two times a day (BID) | ORAL | Status: DC

## 2012-12-13 NOTE — Addendum Note (Signed)
Addended by: Ignacia Palma on: 12/13/2012 02:01 PM   Modules accepted: Orders

## 2012-12-19 ENCOUNTER — Telehealth: Payer: Self-pay | Admitting: *Deleted

## 2012-12-19 NOTE — Telephone Encounter (Signed)
Caregiver for pt Kelly Chandler called - upset med was not called to pharmacy. Talked to [facility] and no record of Metronidazole called in 12/13/12. Directions for med given to pharm. Trying to obtain urine - a commode hat given to caregiver. Stanton Kidney Maurene Hollin RN 12/19/12 2:15PM

## 2012-12-21 ENCOUNTER — Other Ambulatory Visit: Payer: Medicare Other

## 2012-12-22 LAB — URINALYSIS, MICROSCOPIC ONLY: Bacteria, UA: NONE SEEN

## 2012-12-22 LAB — URINALYSIS, ROUTINE W REFLEX MICROSCOPIC
Glucose, UA: NEGATIVE mg/dL
Nitrite: NEGATIVE
Urobilinogen, UA: 1 mg/dL (ref 0.0–1.0)
pH: 6 (ref 5.0–8.0)

## 2013-01-01 ENCOUNTER — Encounter: Payer: Self-pay | Admitting: Internal Medicine

## 2013-01-01 ENCOUNTER — Telehealth: Payer: Self-pay | Admitting: *Deleted

## 2013-01-01 NOTE — Telephone Encounter (Signed)
[name] from group home called - they need a written order to d/c med according to [employer]. They need order to d/c metronidazole 500mg  - took 14 tabs. Will come by clinic to pick up letter. [phone]. Stanton Kidney Khali Albanese RN 01/01/13 11:30AM

## 2013-01-02 NOTE — Telephone Encounter (Signed)
Wrote a note yesterday and gave to [place].

## 2013-01-23 ENCOUNTER — Other Ambulatory Visit: Payer: Self-pay | Admitting: *Deleted

## 2013-01-23 DIAGNOSIS — R32 Unspecified urinary incontinence: Secondary | ICD-10-CM

## 2013-01-23 MED ORDER — OXYBUTYNIN CHLORIDE ER 10 MG PO TB24: 10.0000 mg | ORAL_TABLET | Freq: Every day | ORAL | Status: DC

## 2013-01-23 NOTE — Telephone Encounter (Signed)
Has appt. tomorrow

## 2013-01-24 ENCOUNTER — Ambulatory Visit (INDEPENDENT_AMBULATORY_CARE_PROVIDER_SITE_OTHER): Payer: Medicare Other | Admitting: Internal Medicine

## 2013-01-24 DIAGNOSIS — S069X9A Unspecified intracranial injury with loss of consciousness of unspecified duration, initial encounter: Secondary | ICD-10-CM | POA: Insufficient documentation

## 2013-01-24 DIAGNOSIS — S069X0D Unspecified intracranial injury without loss of consciousness, subsequent encounter: Secondary | ICD-10-CM

## 2013-01-24 DIAGNOSIS — R32 Unspecified urinary incontinence: Secondary | ICD-10-CM

## 2013-01-24 DIAGNOSIS — Z5189 Encounter for other specified aftercare: Secondary | ICD-10-CM

## 2013-01-24 NOTE — Patient Instructions (Signed)
Thanks for your visit - We will have our clinical social worker, Edson Snowball, call you about your diaper prescription - Everything else is up-to-date. - Please followup in 3 months.
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
## 2013-01-24 NOTE — Assessment & Plan Note (Signed)
Stable. - Referral to social work for the diaper prescription issue

## 2013-01-25 ENCOUNTER — Telehealth: Payer: Self-pay | Admitting: Licensed Clinical Social Worker

## 2013-01-25 NOTE — Telephone Encounter (Signed)
Ms. Klunder was referred to CSW for assistance with obtaining adult diapers.  CSW placed call to pt's primary caregiver.  Caregiver states they are still waiting on the approval from [facility]. Chart indicates CMN was completed and signed.  CSW placed call to [facility] and requested a SMN to be faxed.  Once SMN is signed both forms will be faxed back to [facility].  Front Office is aware of current issue and will provide to [facility] Liaison today.  CSW placed call back to pt's caregiver to request notification if pt does not receive items within two weeks, alternate provider may be necessary.

## 2013-01-29 NOTE — Progress Notes (Signed)
I saw and evaluated the patient.  I personally confirmed the key portions of the history and exam documented by Dr. Cater and I reviewed pertinent patient test results.  The assessment, diagnosis, and plan were formulated together and I agree with the documentation in the resident's note. 

## 2013-02-19 ENCOUNTER — Telehealth: Payer: Self-pay | Admitting: Internal Medicine

## 2013-02-19 NOTE — Telephone Encounter (Signed)
Called [facility] this morning and spoke with "Bonita Quin" in commercial billing.  Patient has been waiting to get her Disposable Pull Ups for quite some time.  Bonita Quin stated that they were still waiting on the CMN to be faxed.  Forms were previously faxed and hand delivered, both forms were also scanned into epic for verification.  Bonita Quin apologized for the delay in the handling of this patient's order.  Forms were faxed to the Main DME fax number [phone] and [phone].  Forms have been confirmed rec'd per Bonita Quin.   Bonita Quin will also personally contact "[name]" the caregiver and apologize for the delay.

## 2013-02-20 NOTE — Telephone Encounter (Signed)
CSW spoke with [name], caregiver, today 02/20/13.  Caregiver did receive call from [facility] and is pleased with outcome.

## 2013-04-01 ENCOUNTER — Other Ambulatory Visit: Payer: Self-pay | Admitting: *Deleted

## 2013-04-01 DIAGNOSIS — D649 Anemia, unspecified: Secondary | ICD-10-CM

## 2013-04-01 MED ORDER — FERROUS FUMARATE 325 (106 FE) MG PO TABS: 1.0000 | ORAL_TABLET | Freq: Every day | ORAL | Status: DC

## 2013-04-02 ENCOUNTER — Ambulatory Visit (INDEPENDENT_AMBULATORY_CARE_PROVIDER_SITE_OTHER): Payer: Medicare Other | Admitting: Internal Medicine

## 2013-04-02 ENCOUNTER — Encounter: Payer: Self-pay | Admitting: Internal Medicine

## 2013-04-02 VITALS — BP 118/79 | HR 72 | Temp 97.0°F | Ht 62.0 in | Wt 197.6 lb

## 2013-04-02 DIAGNOSIS — N390 Urinary tract infection, site not specified: Secondary | ICD-10-CM

## 2013-04-02 LAB — CBC WITH DIFFERENTIAL/PLATELET
Basophils Absolute: 0 10*3/uL (ref 0.0–0.1)
Eosinophils Absolute: 0.1 10*3/uL (ref 0.0–0.7)
Eosinophils Relative: 1 % (ref 0–5)
HCT: 39.2 % (ref 36.0–46.0)
Lymphocytes Relative: 37 % (ref 12–46)
MCH: 29.2 pg (ref 26.0–34.0)
MCHC: 33.7 g/dL (ref 30.0–36.0)
MCV: 86.7 fL (ref 78.0–100.0)
Monocytes Absolute: 0.5 10*3/uL (ref 0.1–1.0)
RDW: 14.1 % (ref 11.5–15.5)
WBC: 5.8 10*3/uL (ref 4.0–10.5)

## 2013-04-02 LAB — POCT URINALYSIS DIPSTICK
Glucose, UA: NEGATIVE
Ketones, UA: NEGATIVE
Spec Grav, UA: >=1.03

## 2013-04-02 LAB — COMPLETE METABOLIC PANEL WITH GFR
ALT: 12 U/L (ref 0–35)
AST: 13 U/L (ref 0–37)
Albumin: 4 g/dL (ref 3.5–5.2)
Alkaline Phosphatase: 64 U/L (ref 39–117)
Calcium: 9.7 mg/dL (ref 8.4–10.5)
Chloride: 106 mEq/L (ref 96–112)
Potassium: 4.5 mEq/L (ref 3.5–5.3)
Sodium: 140 mEq/L (ref 135–145)
Total Protein: 6.5 g/dL (ref 6.0–8.3)

## 2013-04-02 LAB — URINALYSIS, ROUTINE W REFLEX MICROSCOPIC
Bilirubin Urine: NEGATIVE
Hgb urine dipstick: NEGATIVE
Specific Gravity, Urine: 1.023 (ref 1.005–1.030)
Urobilinogen, UA: 1 mg/dL (ref 0.0–1.0)
pH: 6 (ref 5.0–8.0)

## 2013-04-02 NOTE — Assessment & Plan Note (Addendum)
Patient's clinical evaluation is compromised with history of dementia. Urine dipstick remarkable for moderate leukocytes. UTI cannot be fully excluded. I do not suspect an STI. Vital signs otherwise stable. Exam unremarkable.  Plan - urinalysis - Basic labs, including CBC, and a CMP. - Encouraged increased oral fluid intake - Will call with results and if urinalysis reveals a UTI, Will treat  Addendum: 04/06/2013  Talked with Kelly Chandler's care taker over the phone. Pt still has increased frequency. Care taker thinks that "something is not right with her urinary habits". No constitutional symptoms. Urine cx results with 70k mixed infection with enterococcus and e.coli - pansensitive. I will call in PO Nitrofurantoin 100 mg bid for 7 days. Care taker will pick updated AVS on Monday.

## 2013-04-02 NOTE — Patient Instructions (Addendum)
  Your urine culture results revealed possible urine tract infection I will call in antibiotic called Nitrofurantoin to take as 100 mg by mouth twice daily for 7 days.  Again please take plenty of fluids  Please come back if abdominal pain, fevers, chills, nausea, vomiting or fatigue     Urinary Tract Infection A urinary tract infection (UTI) can occur any place along the urinary tract. The tract includes the kidneys, ureters, bladder, and urethra. A type of germ called bacteria often causes a UTI. UTIs are often helped with antibiotic medicine.  HOME CARE   If given, take antibiotics as told by your doctor. Finish them even if you start to feel better.  Drink enough fluids to keep your pee (urine) clear or pale yellow.  Avoid tea, drinks with caffeine, and bubbly (carbonated) drinks.  Pee often. Avoid holding your pee in for a long time.  Pee before and after having sex (intercourse).  Wipe from front to back after you poop (bowel movement) if you are a woman. Use each tissue only once. GET HELP RIGHT AWAY IF:   You have back pain.  You have lower belly (abdominal) pain.  You have chills.  You feel sick to your stomach (nauseous).  You throw up (vomit).  Your burning or discomfort with peeing does not go away.  You have a fever.  Your symptoms are not better in 3 days. MAKE SURE YOU:   Understand these instructions.  Will watch your condition.  Will get help right away if you are not doing well or get worse. Document Released: 11/30/2007 Document Revised: 03/07/2012 Document Reviewed: 01/12/2012 [facility] Patient Information 2014 [place], [place].
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
## 2013-04-03 ENCOUNTER — Telehealth: Payer: Self-pay | Admitting: *Deleted

## 2013-04-03 LAB — URINALYSIS, MICROSCOPIC ONLY: Crystals: NONE SEEN

## 2013-04-03 NOTE — Telephone Encounter (Signed)
Script was sent to pharm as a refill for reg hemocyte, pharm had made a mistake and been filling for quiet awhile hemocyte plus, checked last cbc, ask Tresa Endo, the pharmacist to change to reg hemocyte, she states she is the one that made the mistake but it will be filled from now on as reg not plus, i agreed and am informing you

## 2013-04-03 NOTE — Addendum Note (Signed)
Addended by: Dow Adolph on: 04/03/2013 04:15 PM   Modules accepted: Orders

## 2013-04-04 NOTE — Telephone Encounter (Signed)
OK, thanks for letting me know.  Vivi Barrack, MD  [email or web address] Pager # [phone] Office # [phone]

## 2013-04-05 ENCOUNTER — Other Ambulatory Visit: Payer: Self-pay | Admitting: *Deleted

## 2013-04-05 DIAGNOSIS — E785 Hyperlipidemia, unspecified: Secondary | ICD-10-CM

## 2013-04-05 MED ORDER — PRAVASTATIN SODIUM 40 MG PO TABS: 40.0000 mg | ORAL_TABLET | Freq: Every day | ORAL | Status: AC

## 2013-04-05 NOTE — Progress Notes (Signed)
Case discussed with Dr. Kazibwe soon after the resident saw the patient.  We reviewed the resident's history and exam and pertinent patient test results.  I agree with the assessment, diagnosis, and plan of care documented in the resident's note. 

## 2013-04-06 MED ORDER — NITROFURANTOIN MONOHYD MACRO 100 MG PO CAPS: 100.0000 mg | ORAL_CAPSULE | Freq: Two times a day (BID) | ORAL | Status: DC

## 2013-04-06 NOTE — Addendum Note (Signed)
Addended by: Dow Adolph on: 04/06/2013 03:52 PM   Modules accepted: Orders

## 2013-05-01 ENCOUNTER — Ambulatory Visit (INDEPENDENT_AMBULATORY_CARE_PROVIDER_SITE_OTHER): Payer: Medicare Other | Admitting: Internal Medicine

## 2013-05-01 ENCOUNTER — Encounter: Payer: Self-pay | Admitting: Internal Medicine

## 2013-05-01 VITALS — BP 116/79 | HR 69 | Temp 97.1°F | Ht 62.0 in | Wt 194.6 lb

## 2013-05-01 DIAGNOSIS — Z79899 Other long term (current) drug therapy: Secondary | ICD-10-CM

## 2013-05-01 DIAGNOSIS — R32 Unspecified urinary incontinence: Secondary | ICD-10-CM

## 2013-05-01 DIAGNOSIS — Z0000 Encounter for general adult medical examination without abnormal findings: Secondary | ICD-10-CM

## 2013-05-01 DIAGNOSIS — F3110 Bipolar disorder, current episode manic without psychotic features, unspecified: Secondary | ICD-10-CM

## 2013-05-01 DIAGNOSIS — Z23 Encounter for immunization: Secondary | ICD-10-CM

## 2013-05-01 NOTE — Assessment & Plan Note (Signed)
Her caretaker would like [place] to discontinue her Ferrocite Plus because it costs $35 per month and the family is refusing to pay for it. CBC in October was within normal limits, see below. - Discontinue  Ferrocite Plus with plans to repeat a CBC in 3 months to determine if she needs further supplementation - If this is the case, we can prescribe something more affordable like ferrous sulfate  CBC    Component Value Date/Time   WBC 5.8 04/02/2013 1610   RBC 4.52 04/02/2013 1610   HGB 13.2 04/02/2013 1610   HCT 39.2 04/02/2013 1610   PLT 221 04/02/2013 1610   MCV 86.7 04/02/2013 1610   MCH 29.2 04/02/2013 1610   MCHC 33.7 04/02/2013 1610   RDW 14.1 04/02/2013 1610   LYMPHSABS 2.1 04/02/2013 1610   MONOABS 0.5 04/02/2013 1610   EOSABS 0.1 04/02/2013 1610   BASOSABS 0.0 04/02/2013 1610

## 2013-05-01 NOTE — Assessment & Plan Note (Signed)
For her mental health issues, she goes to the [facility] day [facility]. The psychiatrists there manage all of her psychotropic medications. She still takes Prozac, Seroquel, Depakote, Klonopin. CMP was within normal limits in October of this year. Continue current therapies and follow up.

## 2013-05-01 NOTE — Assessment & Plan Note (Signed)
Flu vaccine administered
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
## 2013-05-01 NOTE — Assessment & Plan Note (Signed)
Urinary habits are back to baseline.  - Continue prescription diapers.

## 2013-05-01 NOTE — Patient Instructions (Addendum)
Thank you for your visit. - I have discontinued ibuprofen and Ferrocite Plus.  - Your blood work in October was perfect. We will repeat this lab work in 3 months to determine if you need to be placed back on iron supplementation since we have stopped your supplement. - Please call the clinic if you develop pain with urination, or increased urinary frequency. - Please return in 3 months.

## 2013-05-02 NOTE — Progress Notes (Signed)
I saw and evaluated the patient.  I personally confirmed the key portions of the history and exam documented by Dr. Cater and I reviewed pertinent patient test results.  The assessment, diagnosis, and plan were formulated together and I agree with the documentation in the resident's note. 

## 2013-05-08 ENCOUNTER — Emergency Department (INDEPENDENT_AMBULATORY_CARE_PROVIDER_SITE_OTHER)
Admission: EM | Admit: 2013-05-08 | Discharge: 2013-05-08 | Disposition: A | Discharge status: Home / Self Care | Payer: Medicare Other | Source: Home / Self Care

## 2013-05-08 ENCOUNTER — Encounter (HOSPITAL_COMMUNITY): Payer: Self-pay | Admitting: Emergency Medicine

## 2013-05-08 DIAGNOSIS — K529 Noninfective gastroenteritis and colitis, unspecified: Secondary | ICD-10-CM

## 2013-05-08 DIAGNOSIS — K5289 Other specified noninfective gastroenteritis and colitis: Secondary | ICD-10-CM

## 2013-05-08 MED ORDER — ONDANSETRON 8 MG PO TBDP: ORAL_TABLET | ORAL | Status: DC

## 2013-05-08 MED ORDER — ONDANSETRON 4 MG PO TBDP
8.0000 mg | ORAL_TABLET | Freq: Once | ORAL | Status: AC
  Administered 2013-05-08: 8 mg via ORAL

## 2013-05-08 MED ORDER — LIDOCAINE-HYDROCORTISONE ACE 3-0.5 % RE CREA: TOPICAL_CREAM | RECTAL | Status: DC

## 2013-05-08 MED ORDER — ONDANSETRON 4 MG PO TBDP
ORAL_TABLET | ORAL | Status: AC
  Filled 2013-05-08: qty 2

## 2013-05-08 MED ORDER — DIPHENOXYLATE-ATROPINE 2.5-0.025 MG PO TABS
1.0000 | ORAL_TABLET | Freq: Four times a day (QID) | ORAL | Status: DC | PRN
Start: 2013-05-08 — End: 2013-05-08

## 2013-05-08 MED ORDER — DIPHENOXYLATE-ATROPINE 2.5-0.025 MG PO TABS: ORAL_TABLET | ORAL | Status: DC

## 2013-05-08 NOTE — ED Notes (Signed)
Caregiver from group home brings pt in for diarrhea onset yest Has had 5 loose stools today... Denies f/v/n, cold sxs Hx of traumatic brain inj, dementia, bipolar She is alert w/no signs of acute distress.
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
## 2013-05-31 ENCOUNTER — Encounter (HOSPITAL_COMMUNITY): Payer: Self-pay | Admitting: Emergency Medicine

## 2013-05-31 ENCOUNTER — Emergency Department (INDEPENDENT_AMBULATORY_CARE_PROVIDER_SITE_OTHER)
Admission: EM | Admit: 2013-05-31 | Discharge: 2013-05-31 | Disposition: A | Discharge status: Home / Self Care | Payer: Medicare Other | Source: Home / Self Care

## 2013-05-31 DIAGNOSIS — R29898 Other symptoms and signs involving the musculoskeletal system: Secondary | ICD-10-CM

## 2013-05-31 LAB — POCT I-STAT, CHEM 8
BUN: 17 mg/dL (ref 6–23)
Chloride: 104 mEq/L (ref 96–112)
Creatinine, Ser: 1.2 mg/dL — ABNORMAL HIGH (ref 0.50–1.10)
Glucose, Bld: 101 mg/dL — ABNORMAL HIGH (ref 70–99)
Potassium: 4.2 mEq/L (ref 3.5–5.1)
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
## 2013-05-31 NOTE — ED Notes (Signed)
C/o bilateral leg pain and weakness.  Care giver states that pt seemed off balance. Denies any injury.  On set today.

## 2013-05-31 NOTE — ED Provider Notes (Signed)
Medical screening examination/treatment/procedure(s) were performed by resident physician or non-physician practitioner and as supervising physician I was immediately available for consultation/collaboration.   KINDL,JAMES DOUGLAS MD.   James D Kindl, MD 05/31/13 1707 

## 2013-06-03 ENCOUNTER — Encounter: Payer: Self-pay | Admitting: Internal Medicine

## 2013-06-03 ENCOUNTER — Ambulatory Visit (INDEPENDENT_AMBULATORY_CARE_PROVIDER_SITE_OTHER): Payer: Medicare Other | Admitting: Internal Medicine

## 2013-06-03 ENCOUNTER — Telehealth: Payer: Self-pay | Admitting: *Deleted

## 2013-06-03 ENCOUNTER — Ambulatory Visit: Payer: Medicare Other | Admitting: Internal Medicine

## 2013-06-03 VITALS — BP 110/60 | HR 60 | Temp 97.8°F | Ht 62.0 in | Wt 187.8 lb

## 2013-06-03 DIAGNOSIS — R32 Unspecified urinary incontinence: Secondary | ICD-10-CM

## 2013-06-03 DIAGNOSIS — R29818 Other symptoms and signs involving the nervous system: Secondary | ICD-10-CM

## 2013-06-03 DIAGNOSIS — R296 Repeated falls: Secondary | ICD-10-CM | POA: Insufficient documentation
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
## 2013-06-03 NOTE — Telephone Encounter (Signed)
Thanks

## 2013-06-03 NOTE — Telephone Encounter (Signed)
Appropriate that pt was placed in open appt slot.

## 2013-06-03 NOTE — Patient Instructions (Signed)
Please follow up with your PCP, Dr. Claudell Kyle in 1 month Please discontinue the medication, oxybutynin today, 06/13/2013 Please continue to provide Ms. Kelly Chandler with oral liquids to keep her hydrated Please call Edson Snowball regarding our discussion for placement issues

## 2013-06-04 LAB — URINALYSIS, ROUTINE W REFLEX MICROSCOPIC
Glucose, UA: NEGATIVE mg/dL
pH: 6 (ref 5.0–8.0)

## 2013-06-04 LAB — BASIC METABOLIC PANEL WITH GFR
BUN: 20 mg/dL (ref 6–23)
CO2: 28 mEq/L (ref 19–32)
Chloride: 103 mEq/L (ref 96–112)
Creat: 1 mg/dL (ref 0.50–1.10)
Glucose, Bld: 81 mg/dL (ref 70–99)

## 2013-06-04 LAB — URINALYSIS, MICROSCOPIC ONLY

## 2013-06-06 ENCOUNTER — Other Ambulatory Visit: Payer: Self-pay | Admitting: Internal Medicine

## 2013-06-06 DIAGNOSIS — N390 Urinary tract infection, site not specified: Secondary | ICD-10-CM

## 2013-06-06 MED ORDER — SULFAMETHOXAZOLE-TMP DS 800-160 MG PO TABS: 1.0000 | ORAL_TABLET | Freq: Two times a day (BID) | ORAL | Status: DC

## 2013-06-06 NOTE — Progress Notes (Signed)
Quick Note:  Will call in prescription for Bactrim DS every 12 hours for 7 days. ______

## 2013-06-11 ENCOUNTER — Encounter: Payer: Self-pay | Admitting: Licensed Clinical Social Worker

## 2013-06-11 ENCOUNTER — Telehealth: Payer: Self-pay | Admitting: Licensed Clinical Social Worker

## 2013-06-11 NOTE — Telephone Encounter (Signed)
Ms. Kelly Chandler placed call to CSW requesting assistance with nursing home placement.  Ms. Kelly Chandler states Ms. Kelly Chandler has exceeded their level of care and pt is in need of nursing home placement.  CSW informed Ms. Kelly Chandler family/guardian will need to find nursing home where they would like placement and place pt on wait list or check availability.  CSW will mail Ms. Kelly Chandler listing of nursing homes from [facility] [place].  CSW can assist with needed paperwork.
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
## 2013-06-24 NOTE — Progress Notes (Signed)
I saw and evaluated the patient.  I personally confirmed the key portions of Dr. Gill's history and exam and reviewed pertinent patient test results.  The assessment, diagnosis, and plan were formulated together and I agree with the documentation in the resident's note. 

## 2013-07-08 ENCOUNTER — Telehealth: Payer: Self-pay | Admitting: Licensed Clinical Social Worker

## 2013-07-08 ENCOUNTER — Ambulatory Visit (INDEPENDENT_AMBULATORY_CARE_PROVIDER_SITE_OTHER): Payer: Medicare Other | Admitting: *Deleted

## 2013-07-08 DIAGNOSIS — Z0000 Encounter for general adult medical examination without abnormal findings: Secondary | ICD-10-CM

## 2013-07-08 MED ORDER — TUBERCULIN PPD 5 UNIT/0.1ML ID SOLN: 5.0000 [IU] | Freq: Once | INTRADERMAL | Status: DC

## 2013-07-08 NOTE — Telephone Encounter (Signed)
Please schedule pt for TB test

## 2013-07-08 NOTE — Telephone Encounter (Signed)
Ms. Kennith CenterLiggins has been working with [name] to obtain appropriate NF placement.  According to Ms. Kennith CenterLiggins, [facility] but in need of a recent TB test.  Caregiver and pt in need of TB test for NF admission.  CSW informed Ms. Kennith CenterLiggins will inform nursing and PCP.

## 2013-07-08 NOTE — Telephone Encounter (Signed)
Please schedule pt appt for TB skin test

## 2013-07-10 LAB — TB SKIN TEST
INDURATION: 0 mm
TB Skin Test: NEGATIVE

## 2013-07-11 ENCOUNTER — Ambulatory Visit: Payer: Medicare Other | Admitting: Internal Medicine

## 2013-07-16 ENCOUNTER — Encounter: Payer: Self-pay | Admitting: Internal Medicine

## 2013-07-16 ENCOUNTER — Ambulatory Visit (INDEPENDENT_AMBULATORY_CARE_PROVIDER_SITE_OTHER): Payer: Medicare Other | Admitting: Internal Medicine

## 2013-07-16 VITALS — Ht 66.0 in | Wt 181.3 lb

## 2013-07-16 DIAGNOSIS — F0390 Unspecified dementia without behavioral disturbance: Secondary | ICD-10-CM

## 2013-07-16 DIAGNOSIS — S51009A Unspecified open wound of unspecified elbow, initial encounter: Secondary | ICD-10-CM

## 2013-07-16 DIAGNOSIS — S51001A Unspecified open wound of right elbow, initial encounter: Secondary | ICD-10-CM

## 2013-07-16 DIAGNOSIS — N390 Urinary tract infection, site not specified: Secondary | ICD-10-CM

## 2013-07-16 LAB — CBC
HEMATOCRIT: 40.2 % (ref 36.0–46.0)
Hemoglobin: 13.5 g/dL (ref 12.0–15.0)
MCH: 29.2 pg (ref 26.0–34.0)
MCHC: 33.6 g/dL (ref 30.0–36.0)
MCV: 86.8 fL (ref 78.0–100.0)
PLATELETS: 149 10*3/uL — AB (ref 150–400)
RBC: 4.63 MIL/uL (ref 3.87–5.11)
RDW: 14.7 % (ref 11.5–15.5)
WBC: 4 10*3/uL (ref 4.0–10.5)

## 2013-07-16 LAB — POCT URINALYSIS DIPSTICK
Bilirubin, UA: NEGATIVE
Glucose, UA: NEGATIVE
Ketones, UA: NEGATIVE
Leukocytes, UA: NEGATIVE
NITRITE UA: NEGATIVE
PH UA: 5.5
PROTEIN UA: NEGATIVE
SPEC GRAV UA: 1.02
UROBILINOGEN UA: 1

## 2013-07-16 NOTE — Patient Instructions (Signed)
In terms of your urine infection we will do some more test to see if you need Antibiotics. We will call you when those results are known.   In terms of your elbow wound. Keep it protected but allow for normal healing.   We are also rechecking your blood level since you have finished your iron supplementation to decide whether you should start iron again.   Thank you.
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
## 2013-07-17 DIAGNOSIS — S51001A Unspecified open wound of right elbow, initial encounter: Secondary | ICD-10-CM | POA: Insufficient documentation

## 2013-07-17 DIAGNOSIS — N390 Urinary tract infection, site not specified: Secondary | ICD-10-CM | POA: Insufficient documentation

## 2013-07-17 LAB — URINALYSIS, ROUTINE W REFLEX MICROSCOPIC
Bilirubin Urine: NEGATIVE
GLUCOSE, UA: NEGATIVE mg/dL
HGB URINE DIPSTICK: NEGATIVE
Ketones, ur: NEGATIVE mg/dL
NITRITE: NEGATIVE
PH: 6 (ref 5.0–8.0)
PROTEIN: NEGATIVE mg/dL
Specific Gravity, Urine: 1.017 (ref 1.005–1.030)
Urobilinogen, UA: 1 mg/dL (ref 0.0–1.0)

## 2013-07-17 LAB — URINALYSIS, MICROSCOPIC ONLY
CRYSTALS: NONE SEEN
Casts: NONE SEEN

## 2013-07-17 NOTE — Assessment & Plan Note (Signed)
Patient seems to have had progression of disease stressing the group home's ability to take care of her needs. The patient is not experiencing any behavioral changes or outbursts that need management. -CSW referral to help with placement to a long-term facility

## 2013-07-17 NOTE — Assessment & Plan Note (Signed)
This was secondary to a fall. No concerning signs for infection or abscess. Wound has good granulation tissue. Would advise to keep wound covered as the patient has repetitive hand and arm movements that could cause repeat trauma to the area. -Symptomatic management -Caregiver was educated on signs or symptoms patient may be having that would be concerning for abscess or cellulitis that would require attention

## 2013-07-17 NOTE — Assessment & Plan Note (Signed)
Patient has a difficult living situation in a group home that is now expanded beyond the scope of their care but they're able to provide. Because of the patient's dementia she is unable to state when she needs changing her diapers. And she's had a history of UTIs in the past including Escherichia coli and enterococcus. The patient doesn't have any systemic signs of infection. Urine dipstick in clinic did not show leukocyte Esterase or nitrites.  -UA, urine culture -Will call group home if any antibiotic or treatment is needed at this time

## 2013-07-19 ENCOUNTER — Encounter: Payer: Self-pay | Admitting: Internal Medicine

## 2013-07-19 LAB — URINE CULTURE: Colony Count: 10000

## 2013-07-21 NOTE — Progress Notes (Signed)
Case discussed with Dr. Sadek soon after the resident saw the patient.  We reviewed the resident's history and exam and pertinent patient test results.  I agree with the assessment, diagnosis, and plan of care documented in the resident's note. 

## 2013-07-22 ENCOUNTER — Telehealth: Payer: Self-pay | Admitting: Licensed Clinical Social Worker

## 2013-07-22 NOTE — Telephone Encounter (Signed)
Ms. Kennith CenterLiggins placed call to CSW this morning, requesting if CSW could get Ms. Barrasso admitted to hospital for 24 hr obs for NF placement.  CSW informed Ms. Kennith CenterLiggins that would be a physician determination based on medical necessity.  Ms. Kennith CenterLiggins states she is having a difficulty time placing [name] in NF due to needing the monthly fee upfront for payment issues.  According to Ms. Kennith CenterLiggins, facilities have told her they can receive pt directly from hospital without fee.  CSW encouraged Ms. Kennith CenterLiggins to touch base with pt's Medicaid caseworker.  CSW expecting a call from DSS NF placement worker and will inquire as well.  Ms. Kennith CenterLiggins aware CSW is available to assist as needed.

## 2013-07-23 ENCOUNTER — Encounter: Payer: Self-pay | Admitting: Internal Medicine

## 2013-07-23 NOTE — Telephone Encounter (Signed)
CSW placed call to Kelly Chandler with [facility].  Due to pt have a psychiatric diagnosis, will need letter from physician indicating dementia supercedes psychiatric illness.  This will facilitate obtaining PASSAR.  Discussed with physician that most recently saw Kelly Chandler, Kelly Chandler in agreement.  CSW placed call to Kelly Chandler, pt's legal guardian.  Per guardian request, updated guardian's name to Kelly Chandler  as this reflects legal name on guardianship. Guardian agreeable to CSW to fax [facility].  Referral faxed.

## 2013-08-05 NOTE — Telephone Encounter (Signed)
PASSAR letter obtained, Level I.  PA LOC initiated through [facility].  LOC placed in physicians mailbox.  Will need to fax LOC once signed.

## 2013-08-06 ENCOUNTER — Other Ambulatory Visit: Payer: Self-pay | Admitting: Internal Medicine

## 2013-08-07 ENCOUNTER — Ambulatory Visit (INDEPENDENT_AMBULATORY_CARE_PROVIDER_SITE_OTHER): Payer: Medicare Other | Admitting: Internal Medicine

## 2013-08-07 ENCOUNTER — Encounter: Payer: Self-pay | Admitting: Internal Medicine

## 2013-08-07 VITALS — BP 112/80 | HR 72 | Temp 97.3°F | Wt 176.6 lb

## 2013-08-07 DIAGNOSIS — R32 Unspecified urinary incontinence: Secondary | ICD-10-CM

## 2013-08-07 DIAGNOSIS — F0390 Unspecified dementia without behavioral disturbance: Secondary | ICD-10-CM

## 2013-08-07 NOTE — Assessment & Plan Note (Signed)
No signs or symptoms currently of urinary tract infection. - Continue to monitor
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
## 2013-08-07 NOTE — Assessment & Plan Note (Signed)
Patient needs a higher level of care than the group home can provide. She requires 24 hour monitoring and assistance. She is a fall risk, and she is incontinent of stool and urine. [name] is working on outpatient SNF placement. By the notes, it appears most things have been done including a PASSAR letter. - I will touch base with Shana about anything else I need to do to speed up the process

## 2013-08-07 NOTE — Patient Instructions (Addendum)
It was a pleasure seeing you today. - I will touch base with Shana, our social worker, about SNF placement and if there is anything else I can do to speed up the process. - The patient has any traumatic falls or hits her head, then please call clinic or bring her to the emergency room. - If the foul smelling urine returns or worsens, or the patient appears to have pain with urination or more frequent urination, please return for testing. - Please return in 3 months.

## 2013-08-08 NOTE — Progress Notes (Signed)
INTERNAL MEDICINE TEACHING ATTENDING ADDENDUM - Verna Hamon, DO: I reviewed with the resident Dr. Cater, at the time of visit,  the medical history, physical examination, diagnosis and results of tests and treatment and I agree with the patient's care as documented. 

## 2013-08-27 ENCOUNTER — Telehealth: Payer: Self-pay | Admitting: Licensed Clinical Social Worker

## 2013-08-27 NOTE — Telephone Encounter (Signed)
[facility] requesting CSW to fax PASSAR and FL2 to agency.  CSW faxed PASRR Level I determination, Prior Approval Confirmation, TB results, and FL2.  Agency states they will contact pt and family.

## 2013-08-27 NOTE — Telephone Encounter (Signed)
CSW returned call to Ms. Kennith CenterLiggins.  All paperwork [facility] has been completed in [facility].  Provided Ms. Kennith CenterLiggins with contact name and number for [facility].  Ms. Kennith CenterLiggins aware CSW is available to assist as needed.

## 2013-09-03 ENCOUNTER — Encounter (HOSPITAL_COMMUNITY): Payer: Self-pay | Admitting: Emergency Medicine

## 2013-09-03 ENCOUNTER — Emergency Department (HOSPITAL_COMMUNITY): Payer: Medicare Other

## 2013-09-03 ENCOUNTER — Inpatient Hospital Stay (HOSPITAL_COMMUNITY)
Admission: EM | Admit: 2013-09-03 | Discharge: 2013-09-09 | DRG: 871 | Disposition: A | Discharge status: Skilled Nursing Facility | Payer: Medicare Other | Attending: Internal Medicine | Admitting: Internal Medicine

## 2013-09-03 DIAGNOSIS — R32 Unspecified urinary incontinence: Secondary | ICD-10-CM | POA: Diagnosis present

## 2013-09-03 DIAGNOSIS — R824 Acetonuria: Secondary | ICD-10-CM | POA: Diagnosis present

## 2013-09-03 DIAGNOSIS — N390 Urinary tract infection, site not specified: Secondary | ICD-10-CM | POA: Diagnosis present

## 2013-09-03 DIAGNOSIS — R509 Fever, unspecified: Secondary | ICD-10-CM | POA: Diagnosis present

## 2013-09-03 DIAGNOSIS — S069X9A Unspecified intracranial injury with loss of consciousness of unspecified duration, initial encounter: Secondary | ICD-10-CM | POA: Diagnosis present

## 2013-09-03 DIAGNOSIS — Z87891 Personal history of nicotine dependence: Secondary | ICD-10-CM

## 2013-09-03 DIAGNOSIS — Y9210 Unspecified residential institution as the place of occurrence of the external cause: Secondary | ICD-10-CM | POA: Diagnosis present

## 2013-09-03 DIAGNOSIS — R269 Unspecified abnormalities of gait and mobility: Secondary | ICD-10-CM | POA: Diagnosis present

## 2013-09-03 DIAGNOSIS — J189 Pneumonia, unspecified organism: Secondary | ICD-10-CM

## 2013-09-03 DIAGNOSIS — F0390 Unspecified dementia without behavioral disturbance: Secondary | ICD-10-CM | POA: Diagnosis present

## 2013-09-03 DIAGNOSIS — S069XAA Unspecified intracranial injury with loss of consciousness status unknown, initial encounter: Secondary | ICD-10-CM | POA: Diagnosis present

## 2013-09-03 DIAGNOSIS — D696 Thrombocytopenia, unspecified: Secondary | ICD-10-CM | POA: Diagnosis present

## 2013-09-03 DIAGNOSIS — F3110 Bipolar disorder, current episode manic without psychotic features, unspecified: Secondary | ICD-10-CM | POA: Diagnosis present

## 2013-09-03 DIAGNOSIS — R4182 Altered mental status, unspecified: Secondary | ICD-10-CM

## 2013-09-03 DIAGNOSIS — Z66 Do not resuscitate: Secondary | ICD-10-CM | POA: Diagnosis present

## 2013-09-03 DIAGNOSIS — R296 Repeated falls: Secondary | ICD-10-CM | POA: Diagnosis present

## 2013-09-03 DIAGNOSIS — E785 Hyperlipidemia, unspecified: Secondary | ICD-10-CM | POA: Diagnosis present

## 2013-09-03 DIAGNOSIS — W19XXXA Unspecified fall, initial encounter: Secondary | ICD-10-CM | POA: Diagnosis present

## 2013-09-03 DIAGNOSIS — A419 Sepsis, unspecified organism: Principal | ICD-10-CM | POA: Diagnosis present

## 2013-09-03 DIAGNOSIS — Z87820 Personal history of traumatic brain injury: Secondary | ICD-10-CM

## 2013-09-03 DIAGNOSIS — Z9181 History of falling: Secondary | ICD-10-CM

## 2013-09-03 DIAGNOSIS — R159 Full incontinence of feces: Secondary | ICD-10-CM | POA: Diagnosis present

## 2013-09-03 HISTORY — DX: Headache: R51

## 2013-09-03 HISTORY — DX: Unspecified intracranial injury with loss of consciousness of unspecified duration, initial encounter: S06.9X9A

## 2013-09-03 LAB — URINALYSIS, ROUTINE W REFLEX MICROSCOPIC
GLUCOSE, UA: NEGATIVE mg/dL
Ketones, ur: >80 mg/dL — AB
Nitrite: NEGATIVE
PROTEIN: NEGATIVE mg/dL
Specific Gravity, Urine: 1.019 (ref 1.005–1.030)
Urobilinogen, UA: 1 mg/dL (ref 0.0–1.0)
pH: 5.5 (ref 5.0–8.0)

## 2013-09-03 LAB — CBC WITH DIFFERENTIAL/PLATELET
Basophils Absolute: 0 10*3/uL (ref 0.0–0.1)
Basophils Relative: 0 % (ref 0–1)
EOS ABS: 0 10*3/uL (ref 0.0–0.7)
Eosinophils Relative: 0 % (ref 0–5)
HEMATOCRIT: 39 % (ref 36.0–46.0)
Hemoglobin: 12.9 g/dL (ref 12.0–15.0)
LYMPHS ABS: 0.8 10*3/uL (ref 0.7–4.0)
LYMPHS PCT: 9 % — AB (ref 12–46)
MCH: 29.5 pg (ref 26.0–34.0)
MCHC: 33.1 g/dL (ref 30.0–36.0)
MCV: 89 fL (ref 78.0–100.0)
Monocytes Absolute: 1.7 10*3/uL — ABNORMAL HIGH (ref 0.1–1.0)
Monocytes Relative: 20 % — ABNORMAL HIGH (ref 3–12)
Neutro Abs: 6.2 10*3/uL (ref 1.7–7.7)
Neutrophils Relative %: 71 % (ref 43–77)
Platelets: 131 10*3/uL — ABNORMAL LOW (ref 150–400)
RBC: 4.38 MIL/uL (ref 3.87–5.11)
RDW: 15.1 % (ref 11.5–15.5)
WBC: 8.7 10*3/uL (ref 4.0–10.5)

## 2013-09-03 LAB — COMPREHENSIVE METABOLIC PANEL
ALT: 13 U/L (ref 0–35)
AST: 27 U/L (ref 0–37)
Albumin: 2.8 g/dL — ABNORMAL LOW (ref 3.5–5.2)
Alkaline Phosphatase: 66 U/L (ref 39–117)
BUN: 14 mg/dL (ref 6–23)
CALCIUM: 9.4 mg/dL (ref 8.4–10.5)
CO2: 25 meq/L (ref 19–32)
Chloride: 96 mEq/L (ref 96–112)
Creatinine, Ser: 1.05 mg/dL (ref 0.50–1.10)
GFR, EST AFRICAN AMERICAN: 67 mL/min — AB (ref >90)
GFR, EST NON AFRICAN AMERICAN: 58 mL/min — AB (ref >90)
GLUCOSE: 85 mg/dL (ref 70–99)
Potassium: 3.8 mEq/L (ref 3.7–5.3)
Sodium: 136 mEq/L — ABNORMAL LOW (ref 137–147)
Total Bilirubin: 0.8 mg/dL (ref 0.3–1.2)
Total Protein: 6.3 g/dL (ref 6.0–8.3)

## 2013-09-03 LAB — URINE MICROSCOPIC-ADD ON

## 2013-09-03 LAB — MRSA PCR SCREENING: MRSA by PCR: NEGATIVE

## 2013-09-03 LAB — I-STAT CG4 LACTIC ACID, ED: Lactic Acid, Venous: 0.85 mmol/L (ref 0.5–2.2)

## 2013-09-03 MED ORDER — DEXTROSE 5 % IV SOLN
500.0000 mg | Freq: Two times a day (BID) | INTRAVENOUS | Status: DC
  Administered 2013-09-03 – 2013-09-04 (×2): 500 mg via INTRAVENOUS
  Filled 2013-09-03 (×3): qty 5

## 2013-09-03 MED ORDER — ACETAMINOPHEN 650 MG RE SUPP
650.0000 mg | Freq: Once | RECTAL | Status: AC
  Administered 2013-09-03: 650 mg via RECTAL
  Filled 2013-09-03: qty 1

## 2013-09-03 MED ORDER — FLUOXETINE HCL 20 MG PO CAPS
60.0000 mg | ORAL_CAPSULE | Freq: Every day | ORAL | Status: DC
  Administered 2013-09-04: 60 mg via ORAL
  Filled 2013-09-03: qty 3

## 2013-09-03 MED ORDER — QUETIAPINE FUMARATE 400 MG PO TABS
400.0000 mg | ORAL_TABLET | Freq: Every day | ORAL | Status: DC
  Filled 2013-09-03: qty 1

## 2013-09-03 MED ORDER — OXYBUTYNIN CHLORIDE ER 10 MG PO TB24
10.0000 mg | ORAL_TABLET | Freq: Every day | ORAL | Status: DC
  Administered 2013-09-04 – 2013-09-08 (×5): 10 mg via ORAL
  Filled 2013-09-03 (×6): qty 1

## 2013-09-03 MED ORDER — DIVALPROEX SODIUM ER 500 MG PO TB24
1000.0000 mg | ORAL_TABLET | Freq: Every day | ORAL | Status: DC
  Filled 2013-09-03: qty 2

## 2013-09-03 MED ORDER — CLONAZEPAM 0.5 MG PO TABS
0.5000 mg | ORAL_TABLET | Freq: Every day | ORAL | Status: DC
  Administered 2013-09-05 – 2013-09-08 (×4): 0.5 mg via ORAL
  Filled 2013-09-03 (×5): qty 1

## 2013-09-03 MED ORDER — ACETAMINOPHEN 650 MG RE SUPP
650.0000 mg | Freq: Four times a day (QID) | RECTAL | Status: DC | PRN
  Administered 2013-09-04: 650 mg via RECTAL
  Filled 2013-09-03: qty 1

## 2013-09-03 MED ORDER — DEXTROSE 5 % IV SOLN
1.0000 g | Freq: Once | INTRAVENOUS | Status: AC
  Administered 2013-09-03: 1 g via INTRAVENOUS
  Filled 2013-09-03: qty 10

## 2013-09-03 MED ORDER — DEXTROSE-NACL 5-0.9 % IV SOLN
INTRAVENOUS | Status: DC
  Administered 2013-09-03: 23:00:00 via INTRAVENOUS
  Administered 2013-09-04: 150 mL/h via INTRAVENOUS

## 2013-09-03 MED ORDER — SIMVASTATIN 20 MG PO TABS
20.0000 mg | ORAL_TABLET | Freq: Every day | ORAL | Status: DC
  Administered 2013-09-05 – 2013-09-08 (×4): 20 mg via ORAL
  Filled 2013-09-03 (×6): qty 1

## 2013-09-03 MED ORDER — SODIUM CHLORIDE 0.9 % IV SOLN
INTRAVENOUS | Status: DC
  Administered 2013-09-03: 20:00:00 via INTRAVENOUS

## 2013-09-03 MED ORDER — DEXTROSE 5 % IV SOLN
1.0000 g | INTRAVENOUS | Status: DC
  Administered 2013-09-04: 1 g via INTRAVENOUS
  Filled 2013-09-03: qty 10

## 2013-09-03 MED ORDER — SODIUM CHLORIDE 0.9 % IV BOLUS (SEPSIS)
1000.0000 mL | Freq: Once | INTRAVENOUS | Status: AC
  Administered 2013-09-03: 1000 mL via INTRAVENOUS

## 2013-09-03 MED ORDER — ACETAMINOPHEN 325 MG PO TABS: 650.0000 mg | ORAL_TABLET | Freq: Four times a day (QID) | ORAL | Status: DC | PRN

## 2013-09-03 NOTE — ED Notes (Signed)
abx started before blood cultures were order

## 2013-09-03 NOTE — Progress Notes (Signed)
Clinical Social Work Department BRIEF PSYCHOSOCIAL ASSESSMENT 09/03/2013  Patient:  Kelly Chandler, Kelly Chandler     Account Number:  [id number]     Admit date:  09/03/2013  Clinical Social Worker:  [employer]a [employer]  Date/Time:  09/03/2013 02:00 PM  Referred by:  CSW  Date Referred:  09/03/2013 Referred for  ALF Placement   Other Referral:   Interview type:  Other - See comment Other interview type:   Patient caregiver from [facility], Sharyn Lull    PSYCHOSOCIAL DATA Living Status:  FACILITY Admitted from facility:   Level of care:  Assisted Living Primary support name:  Albertine Zeya Primary support relationship to patient:  SIBLING Degree of support available:   Pt guardian, and sister  [phone], [phone]    CURRENT CONCERNS Current Concerns  Post-Acute Placement   Other Concerns:    SOCIAL WORK ASSESSMENT / PLAN CSW met with pt and pt caregiver at bedside to complete psychosocial assessment. Patient unable to engage in assessment due to history of TBI. Group home owner, Sharyn Lull at bedside. Per caregiver, patient has  guardian, who is pt sister Altha Harm "Otila Kluver" Elie Confer.  Per Sharyn Lull, patient guardian and group home have been working towards placement for patient in a skilled nursing facility due to patient having increased falls. Per Sharyn Lull, patient had been reviewed by [facility] for placement however placement had been delayed. Sharyn Lull requested CSW to speak with [facility] to discuss patient placement    CSW spoke with pt siter/guardian Lezlie Lye who agreed that patient was needing skilled nursing placement due to increased needs. Patient guadian thanked csw for concern support. Pt sister hopeful patient can transition to [facility] or [facility] when medically stable.    Patient was assessed by Roderic Ovens from [facility], who is familiar with patient and is aware of patietn  current needs. [facility] to evaluate patient current condition through support from CSW to assist with placement when medically stable.   Assessment/plan status:  Psychosocial Support/Ongoing Assessment of Needs Other assessment/ plan:   Information/referral to community resources:   none identified at this time    PATIENT'S/FAMILY'S RESPONSE TO PLAN OF CARE: Patient sister/guardian, Otila Kluver, Patient group home caregiver, Gwinda [place] csw for concern and support. Patient sister/guardian hopeful for patient to dc to [facility] when medically stable.       Noreene Larsson [phone]  ED CSW 09/03/2013 1432pm

## 2013-09-03 NOTE — ED Notes (Signed)
Kelli ChurnMichelle Liggins, pt caregiver  [phone]

## 2013-09-03 NOTE — ED Notes (Signed)
[facility]
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
## 2013-09-03 NOTE — ED Notes (Signed)
[facility] called for transport. 
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
## 2013-09-03 NOTE — Progress Notes (Signed)
Report called and received from ED charge nurse.
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
## 2013-09-04 ENCOUNTER — Inpatient Hospital Stay (HOSPITAL_COMMUNITY): Payer: Medicare Other

## 2013-09-04 ENCOUNTER — Observation Stay (HOSPITAL_COMMUNITY): Payer: Medicare Other

## 2013-09-04 DIAGNOSIS — R509 Fever, unspecified: Secondary | ICD-10-CM

## 2013-09-04 DIAGNOSIS — J189 Pneumonia, unspecified organism: Secondary | ICD-10-CM

## 2013-09-04 DIAGNOSIS — W19XXXA Unspecified fall, initial encounter: Secondary | ICD-10-CM

## 2013-09-04 DIAGNOSIS — N390 Urinary tract infection, site not specified: Secondary | ICD-10-CM | POA: Diagnosis present

## 2013-09-04 DIAGNOSIS — R4182 Altered mental status, unspecified: Secondary | ICD-10-CM

## 2013-09-04 LAB — RAPID URINE DRUG SCREEN, HOSP PERFORMED
Amphetamines: NOT DETECTED
Barbiturates: NOT DETECTED
Benzodiazepines: NOT DETECTED
Cocaine: NOT DETECTED
OPIATES: NOT DETECTED
Tetrahydrocannabinol: NOT DETECTED

## 2013-09-04 LAB — LACTIC ACID, PLASMA: LACTIC ACID, VENOUS: 2.7 mmol/L — AB (ref 0.5–2.2)

## 2013-09-04 LAB — COMPREHENSIVE METABOLIC PANEL
ALK PHOS: 64 U/L (ref 39–117)
ALT: 15 U/L (ref 0–35)
AST: 31 U/L (ref 0–37)
Albumin: 2.2 g/dL — ABNORMAL LOW (ref 3.5–5.2)
BUN: 9 mg/dL (ref 6–23)
CO2: 21 meq/L (ref 19–32)
Calcium: 8.5 mg/dL (ref 8.4–10.5)
Chloride: 101 mEq/L (ref 96–112)
Creatinine, Ser: 0.96 mg/dL (ref 0.50–1.10)
GFR calc Af Amer: 75 mL/min — ABNORMAL LOW (ref >90)
GFR, EST NON AFRICAN AMERICAN: 65 mL/min — AB (ref >90)
GLUCOSE: 102 mg/dL — AB (ref 70–99)
POTASSIUM: 3.9 meq/L (ref 3.7–5.3)
SODIUM: 134 meq/L — AB (ref 137–147)
Total Bilirubin: 0.5 mg/dL (ref 0.3–1.2)
Total Protein: 5.5 g/dL — ABNORMAL LOW (ref 6.0–8.3)

## 2013-09-04 LAB — CBC
HCT: 39.4 % (ref 36.0–46.0)
Hemoglobin: 13 g/dL (ref 12.0–15.0)
MCH: 29.7 pg (ref 26.0–34.0)
MCHC: 33 g/dL (ref 30.0–36.0)
MCV: 90 fL (ref 78.0–100.0)
Platelets: 98 10*3/uL — ABNORMAL LOW (ref 150–400)
RBC: 4.38 MIL/uL (ref 3.87–5.11)
RDW: 15.5 % (ref 11.5–15.5)
WBC: 12.3 10*3/uL — ABNORMAL HIGH (ref 4.0–10.5)

## 2013-09-04 LAB — CK: CK TOTAL: 148 U/L (ref 7–177)

## 2013-09-04 LAB — VALPROIC ACID LEVEL: Valproic Acid Lvl: 121.9 ug/mL — ABNORMAL HIGH (ref 50.0–100.0)

## 2013-09-04 LAB — GLUCOSE, CAPILLARY: Glucose-Capillary: 107 mg/dL — ABNORMAL HIGH (ref 70–99)

## 2013-09-04 MED ORDER — VALPROIC ACID 250 MG PO CAPS
500.0000 mg | ORAL_CAPSULE | Freq: Two times a day (BID) | ORAL | Status: DC
  Administered 2013-09-05: 500 mg via ORAL
  Filled 2013-09-04 (×3): qty 2

## 2013-09-04 MED ORDER — BIOTENE DRY MOUTH MT LIQD
15.0000 mL | Freq: Two times a day (BID) | OROMUCOSAL | Status: DC
  Administered 2013-09-04 – 2013-09-08 (×10): 15 mL via OROMUCOSAL

## 2013-09-04 MED ORDER — PIPERACILLIN-TAZOBACTAM 3.375 G IVPB
3.3750 g | Freq: Three times a day (TID) | INTRAVENOUS | Status: DC
  Administered 2013-09-04 – 2013-09-06 (×6): 3.375 g via INTRAVENOUS
  Filled 2013-09-04 (×9): qty 50

## 2013-09-04 MED ORDER — VANCOMYCIN HCL IN DEXTROSE 1-5 GM/200ML-% IV SOLN
1000.0000 mg | Freq: Two times a day (BID) | INTRAVENOUS | Status: DC
  Administered 2013-09-04 – 2013-09-06 (×4): 1000 mg via INTRAVENOUS
  Filled 2013-09-04 (×5): qty 200

## 2013-09-04 MED ORDER — SODIUM CHLORIDE 0.9 % IV SOLN
INTRAVENOUS | Status: AC
  Administered 2013-09-04: 150 mL/h via INTRAVENOUS

## 2013-09-04 MED ORDER — SODIUM CHLORIDE 0.9 % IV BOLUS (SEPSIS)
1000.0000 mL | Freq: Once | INTRAVENOUS | Status: AC
  Administered 2013-09-04: 1000 mL via INTRAVENOUS

## 2013-09-04 NOTE — Progress Notes (Signed)
Night Float Interim Progress Note  Called by RN for Kelly Chandler's blood pressure down to 89/46s.  Went to evaluate patient alongside Dr. Andrey CampanileWilson.  Kelly Chandler was sleeping comfortably in bed. BP confirmed manually down to 80/50. HR stable.   Vitals reviewed. O2 down to 87-88% on room air, improved to 94% on 2L [place] O2, no signs of respiratory distress.  General: sleeping in bed but easily arousable Cardiac: RRR Pulm: shallow breath sounds but clear to auscultation bilaterally Abd: soft, nontender, BS present Ext: warm and well perfused, moving all 4 extremities Neuro: alert oriented to person  Kelly Chandler is a 58 year old female with advanced dementia admitted for recurrent falls and noted to Tmax up to 102.43F this admission.  Mild leukocytosis today up to 12.3 with hypotension.  Initially on Rocephin for possible UTI, changed to IV Vancomycin and Zosyn today for broader coverage with repeat blood cultures. Lactic acid 2.7 around 3pm today. Primary team confirmed DNR status earlier today.   -currently afebrile, 98.58F -NS 1L bolus x1, BP still 80/50s, HR 70s -blood cultures pending -cxr done, read pending -will continue to monitor closely through the night, may need to bolus further -no step down beds available at this time, will continue to monitor on 5W for now.  Will place on tele and continuous pulse oximetry with [place] 2L O2.   Signed: Darden PalmerSamaya Reilley Valentine, MD PGY-2, Internal Medicine Resident Pager: [phone]  09/04/2013,11:08 PM

## 2013-09-04 NOTE — Procedures (Signed)
ELECTROENCEPHALOGRAM REPORT   Patient: Kelly Chandler       Room #: [id number] EEG No. ID: [id number] Age: 58 y.o.        Sex: female Referring Physician: Joines Report Date:  09/04/2013        Interpreting Physician: Thana FarrEYNOLDS,Nathen Balaban D  History: Kelly Chandler is an 58 y.o. female found awake on the floor evaluated to rule out seizure  Medications:  Scheduled: . antiseptic oral rinse  15 mL Mouth Rinse BID  . cefTRIAXone (ROCEPHIN)  IV  1 g Intravenous Q24H  . clonazePAM  0.5 mg Oral QHS  . FLUoxetine  60 mg Oral Daily  . oxybutynin  10 mg Oral QHS  . QUEtiapine  400 mg Oral QHS  . simvastatin  20 mg Oral q1800  . valproate sodium  500 mg Intravenous Q12H    Conditions of Recording:  This is a 16 channel EEG carried out with the patient in the awake state.  Description: The waking background activity is dominated by muscle and movement artifact.  When the posterior background rhythm can be evaluated the best frequency noted is a 6 hertz theta activity that is poorly sustained and poorly organized. There is an absence of faster frequencies noted throughout with theta activity being the predominant rhythm.  No epileptiform activity is noted.   The patient does not drowse or sleep.  Hyperventilation and intermittent photic stimulation were not performed.   IMPRESSION:  This is a technically difficult record due to the predominance of artifact. When able to be visualized the background activity is slow and poorly organized suggesting a diffuse disturbance that is etiologically nonspecific but may include a metabolic encephalopathy among other possibilities.  No epileptiform activity is noted.     Thana FarrLeslie Jairo Bellew, MD [facility] [phone] 09/04/2013, 12:05 PM
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
## 2013-09-04 NOTE — Progress Notes (Signed)
UR completed. Patient changed to inpatient- requiring IVF @ 150cc/hr and IV antibiotics.

## 2013-09-04 NOTE — Progress Notes (Signed)
Pt presents with low blood pressure of 80/50, manual BP taken to verify. MD was notified and on phone increased NS to per hour.  In addition came on unit to visualize pt. MD to put in more orders. Will continue to monitor.

## 2013-09-04 NOTE — Progress Notes (Signed)
EEG Completed; Results Pending  
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
## 2013-09-04 NOTE — Progress Notes (Signed)
Pt temp 102, tylenol given by night RN, follow up temp was 100.2. MD aware. Will continue to monitor.

## 2013-09-04 NOTE — Progress Notes (Signed)
I spoke with the patient's sister, Carmon SailsChristine [place],  regarding the patient. During our conversation, Ms. Duke SalviaRandolph informed me that she is the patients legal guardian and makes decisions about her care. I inquired about the patient's desires regarding DNR. Ms. Duke SalviaRandolph informed me that the patient is DNR. I instructed Ms. Duke SalviaRandolph to bring documentation to the hospital stating that she is the legal guardian. Ms. Duke SalviaRandolph agreed to do this this evening. I have placed the DNR order at this time.   Angelina Sheriffhris Leoncio Hansen, MD
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
## 2013-09-05 DIAGNOSIS — J189 Pneumonia, unspecified organism: Secondary | ICD-10-CM

## 2013-09-05 LAB — CBC WITH DIFFERENTIAL/PLATELET
Basophils Absolute: 0 10*3/uL (ref 0.0–0.1)
Basophils Relative: 0 % (ref 0–1)
EOS ABS: 0 10*3/uL (ref 0.0–0.7)
EOS PCT: 0 % (ref 0–5)
HEMATOCRIT: 36.3 % (ref 36.0–46.0)
Hemoglobin: 12 g/dL (ref 12.0–15.0)
LYMPHS ABS: 0.9 10*3/uL (ref 0.7–4.0)
LYMPHS PCT: 10 % — AB (ref 12–46)
MCH: 29.9 pg (ref 26.0–34.0)
MCHC: 33.1 g/dL (ref 30.0–36.0)
MCV: 90.3 fL (ref 78.0–100.0)
MONO ABS: 1.4 10*3/uL — AB (ref 0.1–1.0)
Monocytes Relative: 15 % — ABNORMAL HIGH (ref 3–12)
Neutro Abs: 7.1 10*3/uL (ref 1.7–7.7)
Neutrophils Relative %: 76 % (ref 43–77)
Platelets: 77 10*3/uL — ABNORMAL LOW (ref 150–400)
RBC: 4.02 MIL/uL (ref 3.87–5.11)
RDW: 15.6 % — AB (ref 11.5–15.5)
WBC: 9.4 10*3/uL (ref 4.0–10.5)

## 2013-09-05 LAB — URINE CULTURE: Colony Count: 9000

## 2013-09-05 LAB — BASIC METABOLIC PANEL
BUN: 7 mg/dL (ref 6–23)
CO2: 22 mEq/L (ref 19–32)
Calcium: 8.2 mg/dL — ABNORMAL LOW (ref 8.4–10.5)
Chloride: 106 mEq/L (ref 96–112)
Creatinine, Ser: 0.94 mg/dL (ref 0.50–1.10)
GFR, EST AFRICAN AMERICAN: 77 mL/min — AB (ref >90)
GFR, EST NON AFRICAN AMERICAN: 67 mL/min — AB (ref >90)
GLUCOSE: 103 mg/dL — AB (ref 70–99)
Potassium: 3.9 mEq/L (ref 3.7–5.3)
Sodium: 139 mEq/L (ref 137–147)

## 2013-09-05 LAB — GLUCOSE, CAPILLARY
Glucose-Capillary: 65 mg/dL — ABNORMAL LOW (ref 70–99)
Glucose-Capillary: 65 mg/dL — ABNORMAL LOW (ref 70–99)
Glucose-Capillary: 103 mg/dL — ABNORMAL HIGH (ref 70–99)

## 2013-09-05 MED ORDER — VALPROIC ACID 250 MG PO CAPS
250.0000 mg | ORAL_CAPSULE | Freq: Every morning | ORAL | Status: DC
  Administered 2013-09-06 – 2013-09-09 (×4): 250 mg via ORAL
  Filled 2013-09-05 (×5): qty 1

## 2013-09-05 MED ORDER — VALPROIC ACID 250 MG PO CAPS
500.0000 mg | ORAL_CAPSULE | Freq: Every evening | ORAL | Status: DC
  Administered 2013-09-06 – 2013-09-08 (×3): 500 mg via ORAL
  Filled 2013-09-05 (×4): qty 2

## 2013-09-05 MED ORDER — SODIUM CHLORIDE 0.9 % IV SOLN
INTRAVENOUS | Status: AC
  Administered 2013-09-05: 04:00:00 via INTRAVENOUS

## 2013-09-05 NOTE — Progress Notes (Signed)
Internal Medicine Attending  Date: 09/05/2013  Patient name: Kelly Chandler Medical record number: [id number] Date of birth: 02-19-1956 Age: 58 y.o. Gender: female  I saw and evaluated the patient, and discussed her care on A.M rounds with housestaff.  I reviewed the resident's note by Dr. Glendell DockerKomanski and I agree with the resident's findings and plans as documented in his note.

## 2013-09-05 NOTE — Clinical Social Work Placement (Addendum)
Clinical Social Work Department CLINICAL SOCIAL WORK PLACEMENT NOTE 09/05/2013  Patient:  Leola BrazilWRIGHT,Evalee  Account Number:  [id number] Admit date:  09/03/2013  Clinical Social Worker:  Cherre BlancJOSEPH BRYANT CAMPBELL, [place]  Date/time:  09/05/2013 02:30 PM  Clinical Social Work is seeking post-discharge placement for this patient at the following level of care:   SKILLED NURSING   (*CSW will update this form in Epic as items are completed)   09/05/2013  [facility]family provided with [facility] of Clinical Social Work's list of facilities offering this level of care within the geographic area requested by the patient (or if unable, by the patient's family).  09/05/2013  Patient/family informed of their freedom to choose among providers that offer the needed level of care, that participate in Medicare, Medicaid or managed care program needed by the patient, have an available bed and are willing to accept the patient.  09/05/2013  Patient/family informed of [facility]' ownership interest in [facility], as well as of the fact that they are under no obligation to receive care at this facility.  PASARR submitted to EDS on 09/02/2013 PASARR number received from EDS on 09/02/2013  FL2 transmitted to all facilities in geographic area requested by pt/family on  09/05/2013 FL2 transmitted to all facilities within larger geographic area on   Patient informed that his/her managed care company has contracts with or will negotiate with  certain facilities, including the following:     Patient/family informed of bed offers received:  09/07/13 [facility] Physician recommends and patient chooses bed at    Patient to be transferred to  on   Patient to be transferred to facility by   The following physician request were entered in Epic:   Additional Comments:   Roddie McBryant Campbell, [place], [place], [phone]
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
## 2013-09-05 NOTE — Progress Notes (Signed)
Hypoglycemic Event  CBG: 65  Treatment: 15 GM carbohydrate snack  Symptoms: None  Follow-up CBG: Time:912 CBG Result:103  Possible Reasons for Event: Unknown  Comments/MD notified:gave more OJ and breakfast tray.     Madelin RearWhitaker, Adal Sereno Ray  Remember to initiate Hypoglycemia Order Set & complete

## 2013-09-05 NOTE — Progress Notes (Signed)
Hypoglycemic Event  CBG: 65  Treatment: 15 GM carbohydrate snack  Symptoms: None  Follow-up CBG: Time:819 CBG Result:65  Possible Reasons for Event: Unknown  Comments/MD notified:Gave OJ    Amaurie Wandel Ray  Remember to initiate Hypoglycemia Order Set & complete

## 2013-09-06 LAB — BASIC METABOLIC PANEL
BUN: 6 mg/dL (ref 6–23)
CO2: 24 meq/L (ref 19–32)
CREATININE: 0.84 mg/dL (ref 0.50–1.10)
Calcium: 8.7 mg/dL (ref 8.4–10.5)
Chloride: 107 mEq/L (ref 96–112)
GFR calc Af Amer: 88 mL/min — ABNORMAL LOW (ref >90)
GFR, EST NON AFRICAN AMERICAN: 76 mL/min — AB (ref >90)
GLUCOSE: 97 mg/dL (ref 70–99)
Potassium: 3.4 mEq/L — ABNORMAL LOW (ref 3.7–5.3)
SODIUM: 142 meq/L (ref 137–147)

## 2013-09-06 LAB — CBC WITH DIFFERENTIAL/PLATELET
BASOS ABS: 0 10*3/uL (ref 0.0–0.1)
BASOS PCT: 0 % (ref 0–1)
EOS PCT: 1 % (ref 0–5)
Eosinophils Absolute: 0 10*3/uL (ref 0.0–0.7)
HCT: 34.6 % — ABNORMAL LOW (ref 36.0–46.0)
Hemoglobin: 11.3 g/dL — ABNORMAL LOW (ref 12.0–15.0)
LYMPHS ABS: 0.9 10*3/uL (ref 0.7–4.0)
Lymphocytes Relative: 17 % (ref 12–46)
MCH: 29.1 pg (ref 26.0–34.0)
MCHC: 32.7 g/dL (ref 30.0–36.0)
MCV: 89.2 fL (ref 78.0–100.0)
Monocytes Absolute: 0.6 10*3/uL (ref 0.1–1.0)
Monocytes Relative: 11 % (ref 3–12)
NEUTROS PCT: 71 % (ref 43–77)
Neutro Abs: 3.9 10*3/uL (ref 1.7–7.7)
Platelets: 71 10*3/uL — ABNORMAL LOW (ref 150–400)
RBC: 3.88 MIL/uL (ref 3.87–5.11)
RDW: 15.7 % — AB (ref 11.5–15.5)
WBC: 5.5 10*3/uL (ref 4.0–10.5)

## 2013-09-06 LAB — GLUCOSE, CAPILLARY: GLUCOSE-CAPILLARY: 90 mg/dL (ref 70–99)

## 2013-09-06 MED ORDER — QUETIAPINE FUMARATE 200 MG PO TABS: 200.0000 mg | ORAL_TABLET | Freq: Every day | ORAL | Status: DC

## 2013-09-06 MED ORDER — AMOXICILLIN-POT CLAVULANATE 875-125 MG PO TABS
1.0000 | ORAL_TABLET | Freq: Two times a day (BID) | ORAL | Status: DC
  Administered 2013-09-06 – 2013-09-09 (×7): 1 via ORAL
  Filled 2013-09-06 (×8): qty 1

## 2013-09-06 MED ORDER — FLUOXETINE HCL 20 MG PO CAPS
40.0000 mg | ORAL_CAPSULE | Freq: Every day | ORAL | Status: DC
  Administered 2013-09-06 – 2013-09-09 (×4): 40 mg via ORAL
  Filled 2013-09-06 (×4): qty 2

## 2013-09-06 MED ORDER — QUETIAPINE FUMARATE 200 MG PO TABS
200.0000 mg | ORAL_TABLET | Freq: Once | ORAL | Status: AC
  Administered 2013-09-06: 200 mg via ORAL
  Filled 2013-09-06: qty 1

## 2013-09-06 NOTE — Progress Notes (Signed)
Internal Medicine Attending  Date: 09/06/2013  Patient name: Kelly Brazilatricia Moist Medical record number: [id number] Date of birth: September 05, 1955 Age: 58 y.o. Gender: female  I saw and evaluated the patient on A.M rounds, and discussed her care with housestaff.  I reviewed the resident's note by Dr. Glendell DockerKomanski and I agree with the resident's findings and plans as documented in his note.
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
## 2013-09-06 NOTE — Progress Notes (Signed)
Patient's oxygen saturation 88% on room air. Placed oxygen via nasal cannula on patient at 1L and oxygen saturation increased to 90%. Increased flow rate to 2L and oxygen saturation increased to 92-93%. Will continue to monitor.

## 2013-09-07 LAB — BASIC METABOLIC PANEL
BUN: 7 mg/dL (ref 6–23)
CO2: 27 mEq/L (ref 19–32)
Calcium: 8.6 mg/dL (ref 8.4–10.5)
Chloride: 106 mEq/L (ref 96–112)
Creatinine, Ser: 0.82 mg/dL (ref 0.50–1.10)
GFR calc Af Amer: >90 mL/min (ref >90)
GFR, EST NON AFRICAN AMERICAN: 79 mL/min — AB (ref >90)
GLUCOSE: 85 mg/dL (ref 70–99)
Potassium: 4 mEq/L (ref 3.7–5.3)
SODIUM: 142 meq/L (ref 137–147)

## 2013-09-07 LAB — CBC WITH DIFFERENTIAL/PLATELET
Basophils Absolute: 0 10*3/uL (ref 0.0–0.1)
Basophils Relative: 0 % (ref 0–1)
EOS PCT: 2 % (ref 0–5)
Eosinophils Absolute: 0.1 10*3/uL (ref 0.0–0.7)
HCT: 35.3 % — ABNORMAL LOW (ref 36.0–46.0)
Hemoglobin: 11.5 g/dL — ABNORMAL LOW (ref 12.0–15.0)
LYMPHS ABS: 1.2 10*3/uL (ref 0.7–4.0)
LYMPHS PCT: 31 % (ref 12–46)
MCH: 29 pg (ref 26.0–34.0)
MCHC: 32.6 g/dL (ref 30.0–36.0)
MCV: 88.9 fL (ref 78.0–100.0)
MONOS PCT: 21 % — AB (ref 3–12)
Monocytes Absolute: 0.8 10*3/uL (ref 0.1–1.0)
Neutro Abs: 1.7 10*3/uL (ref 1.7–7.7)
Neutrophils Relative %: 46 % (ref 43–77)
PLATELETS: 78 10*3/uL — AB (ref 150–400)
RBC: 3.97 MIL/uL (ref 3.87–5.11)
RDW: 15.6 % — ABNORMAL HIGH (ref 11.5–15.5)
WBC: 3.8 10*3/uL — AB (ref 4.0–10.5)

## 2013-09-07 LAB — GLUCOSE, CAPILLARY: Glucose-Capillary: 84 mg/dL (ref 70–99)

## 2013-09-07 MED ORDER — POTASSIUM CHLORIDE CRYS ER 20 MEQ PO TBCR: 40.0000 meq | EXTENDED_RELEASE_TABLET | Freq: Two times a day (BID) | ORAL | Status: DC

## 2013-09-07 MED ORDER — QUETIAPINE FUMARATE 100 MG PO TABS
100.0000 mg | ORAL_TABLET | Freq: Every day | ORAL | Status: DC
  Administered 2013-09-07 – 2013-09-08 (×2): 100 mg via ORAL
  Filled 2013-09-07 (×3): qty 1
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
## 2013-09-08 LAB — GLUCOSE, CAPILLARY: Glucose-Capillary: 73 mg/dL (ref 70–99)
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
## 2013-09-09 DIAGNOSIS — F0390 Unspecified dementia without behavioral disturbance: Secondary | ICD-10-CM

## 2013-09-09 DIAGNOSIS — D696 Thrombocytopenia, unspecified: Secondary | ICD-10-CM

## 2013-09-09 DIAGNOSIS — F319 Bipolar disorder, unspecified: Secondary | ICD-10-CM

## 2013-09-09 LAB — CULTURE, BLOOD (ROUTINE X 2)
CULTURE: NO GROWTH
Culture: NO GROWTH

## 2013-09-09 LAB — GLUCOSE, CAPILLARY: GLUCOSE-CAPILLARY: 90 mg/dL (ref 70–99)

## 2013-09-09 MED ORDER — VALPROIC ACID 250 MG PO CAPS: 250.0000 mg | ORAL_CAPSULE | Freq: Every morning | ORAL | Status: AC

## 2013-09-09 MED ORDER — VALPROIC ACID 250 MG PO CAPS
500.0000 mg | ORAL_CAPSULE | Freq: Every evening | ORAL | Status: AC
Start: 2013-09-09 — End: ?

## 2013-09-09 MED ORDER — QUETIAPINE FUMARATE 100 MG PO TABS: 100.0000 mg | ORAL_TABLET | Freq: Every day | ORAL | Status: AC

## 2013-09-09 MED ORDER — AMOXICILLIN-POT CLAVULANATE 875-125 MG PO TABS: 1.0000 | ORAL_TABLET | Freq: Two times a day (BID) | ORAL | Status: DC

## 2013-09-09 NOTE — Discharge Summary (Signed)
INTERNAL MEDICINE ATTENDING DISCHARGE COSIGN   I evaluated the patient on the day of discharge and discussed the discharge plan with my resident team. I agree with the discharge documentation and disposition.   Kaitlin Alcindor 09/09/2013, 1:32 PM
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
## 2013-09-09 NOTE — Discharge Instructions (Signed)
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
## 2013-09-09 NOTE — Clinical Social Work Placement (Signed)
Clinical Social Work Department CLINICAL SOCIAL WORK PLACEMENT NOTE 09/09/2013  Patient:  Kelly Chandler,Kelly Chandler  Account Number:  [id number] Admit date:  09/03/2013  Clinical Social Worker:  Cherre BlancJOSEPH BRYANT Abdirahim Flavell, [place]  Date/time:  09/05/2013 02:30 PM  Clinical Social Work is seeking post-discharge placement for this patient at the following level of care:   SKILLED NURSING   (*CSW will update this form in Epic as items are completed)   09/05/2013  Patient/family provided with [facility] Department of Clinical Social Work's list of facilities offering this level of care within the geographic area requested by the patient (or if unable, by the patient's family).  09/05/2013  Patient/family informed of their freedom to choose among providers that offer the needed level of care, that participate in Medicare, Medicaid or managed care program needed by the patient, have an available bed and are willing to accept the patient.  09/05/2013  Patient/family informed of [facility]' ownership interest in [facility], as well as of the fact that they are under no obligation to receive care at this facility.  PASARR submitted to EDS on 09/02/2013 PASARR number received from EDS on 09/02/2013  FL2 transmitted to all facilities in geographic area requested by pt/family on  09/05/2013 FL2 transmitted to all facilities within larger geographic area on   Patient informed that his/her managed care company has contracts with or will negotiate with  certain facilities, including the following:     Patient/family informed of bed offers received:  09/07/2013 [facility] [place] Physician recommends and patient chooses bed at    [facility] [place] on  09/09/2013 Patient to be transferred to facility by ambulance  The following physician request were entered in Epic:   Additional Comments: Per MD patient ready to DC  to [facility]. RN, patient's HPOA (Christine [place]), and facility notified of DC. RN given number for report. Ambulance transport requested for patient for 4:00PM. DC packet on chart. Patient's HPOA completing paperwork with facility. CSW signing off.    Roddie McBryant Georgiann Neider, [place], [place], [phone]
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
## 2013-09-09 NOTE — Care Management Note (Signed)
    Page 1 of 1   09/09/2013     3:42:24 PM   CARE MANAGEMENT NOTE 09/09/2013  Patient:  Vision Correction CenterWRIGHT,Ellisha   Account Number:  [id number]  Date Initiated:  09/09/2013  Documentation initiated by:  Letha CapeAYLOR,Khloi Rawl  Subjective/Objective Assessment:   dx pna, fall  admit- from [facility]     Action/Plan:   pt eval- rec snf   Anticipated DC Date:  09/09/2013   Anticipated DC Plan:  SKILLED NURSING FACILITY  In-house referral  Clinical Social Worker      DC Planning Services  CM consult      Choice offered to / List presented to:             Status of service:  Completed, signed off Medicare Important Message given?   (If response is "NO", the following Medicare IM given date fields will be blank) Date Medicare IM given:   Date Additional Medicare IM given:    Discharge Disposition:  SKILLED NURSING FACILITY  Per UR Regulation:  Reviewed for med. necessity/level of care/duration of stay  If discussed at Long Length of Stay Meetings, dates discussed:    Comments:

## 2013-09-10 ENCOUNTER — Non-Acute Institutional Stay: Payer: Self-pay | Admitting: Internal Medicine

## 2013-09-10 ENCOUNTER — Other Ambulatory Visit: Payer: Self-pay | Admitting: *Deleted

## 2013-09-10 ENCOUNTER — Encounter: Payer: Self-pay | Admitting: Internal Medicine

## 2013-09-10 DIAGNOSIS — J189 Pneumonia, unspecified organism: Secondary | ICD-10-CM

## 2013-09-10 DIAGNOSIS — F3110 Bipolar disorder, current episode manic without psychotic features, unspecified: Secondary | ICD-10-CM

## 2013-09-10 DIAGNOSIS — R32 Unspecified urinary incontinence: Secondary | ICD-10-CM

## 2013-09-10 DIAGNOSIS — F0390 Unspecified dementia without behavioral disturbance: Secondary | ICD-10-CM

## 2013-09-10 DIAGNOSIS — D696 Thrombocytopenia, unspecified: Secondary | ICD-10-CM | POA: Insufficient documentation

## 2013-09-10 DIAGNOSIS — E785 Hyperlipidemia, unspecified: Secondary | ICD-10-CM

## 2013-09-10 LAB — CULTURE, BLOOD (ROUTINE X 2)
Culture: NO GROWTH
Culture: NO GROWTH

## 2013-09-10 MED ORDER — CLONAZEPAM 0.5 MG PO TABS: ORAL_TABLET | ORAL | Status: AC
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
## 2013-09-10 NOTE — Assessment & Plan Note (Signed)
-  Poor po intake history, staff at SNF to help ensure patient eats meals

## 2013-09-10 NOTE — Assessment & Plan Note (Addendum)
-  Continue Augmentin -Repeat CXR on 3/19

## 2013-09-10 NOTE — Assessment & Plan Note (Signed)
-  Repeat CBC and BMP with next lab draw -Last Plt. 78

## 2013-09-10 NOTE — Assessment & Plan Note (Signed)
-   continue pravastatin

## 2013-09-10 NOTE — Assessment & Plan Note (Signed)
-  Fluoxetine, Klonopin, quetiapine, and valproic acid.

## 2013-09-10 NOTE — Telephone Encounter (Signed)
[facility] [place] 

## 2013-09-10 NOTE — Assessment & Plan Note (Signed)
-  continue Oxybutynin

## 2013-09-13 ENCOUNTER — Ambulatory Visit: Payer: Medicare Other | Admitting: Internal Medicine

## 2013-10-31 ENCOUNTER — Emergency Department (HOSPITAL_COMMUNITY)
Admission: EM | Admit: 2013-10-31 | Discharge: 2013-11-01 | Disposition: A | Discharge status: Home / Self Care | Payer: Medicare Other | Attending: Emergency Medicine | Admitting: Emergency Medicine

## 2013-10-31 ENCOUNTER — Emergency Department (HOSPITAL_COMMUNITY): Payer: Medicare Other

## 2013-10-31 ENCOUNTER — Encounter (HOSPITAL_COMMUNITY): Payer: Self-pay | Admitting: Emergency Medicine

## 2013-10-31 DIAGNOSIS — Y92129 Unspecified place in nursing home as the place of occurrence of the external cause: Secondary | ICD-10-CM

## 2013-10-31 DIAGNOSIS — F0390 Unspecified dementia without behavioral disturbance: Secondary | ICD-10-CM | POA: Insufficient documentation

## 2013-10-31 DIAGNOSIS — Z87820 Personal history of traumatic brain injury: Secondary | ICD-10-CM | POA: Insufficient documentation

## 2013-10-31 DIAGNOSIS — R519 Headache, unspecified: Secondary | ICD-10-CM

## 2013-10-31 DIAGNOSIS — Z79899 Other long term (current) drug therapy: Secondary | ICD-10-CM | POA: Insufficient documentation

## 2013-10-31 DIAGNOSIS — S0990XA Unspecified injury of head, initial encounter: Secondary | ICD-10-CM | POA: Insufficient documentation

## 2013-10-31 DIAGNOSIS — W19XXXA Unspecified fall, initial encounter: Secondary | ICD-10-CM

## 2013-10-31 DIAGNOSIS — F3111 Bipolar disorder, current episode manic without psychotic features, mild: Secondary | ICD-10-CM | POA: Insufficient documentation

## 2013-10-31 DIAGNOSIS — Y9389 Activity, other specified: Secondary | ICD-10-CM | POA: Insufficient documentation

## 2013-10-31 DIAGNOSIS — E785 Hyperlipidemia, unspecified: Secondary | ICD-10-CM | POA: Insufficient documentation

## 2013-10-31 DIAGNOSIS — W1809XA Striking against other object with subsequent fall, initial encounter: Secondary | ICD-10-CM | POA: Insufficient documentation

## 2013-10-31 DIAGNOSIS — Z87891 Personal history of nicotine dependence: Secondary | ICD-10-CM | POA: Insufficient documentation

## 2013-10-31 DIAGNOSIS — IMO0002 Reserved for concepts with insufficient information to code with codable children: Secondary | ICD-10-CM | POA: Insufficient documentation

## 2013-10-31 DIAGNOSIS — Y9210 Unspecified residential institution as the place of occurrence of the external cause: Secondary | ICD-10-CM | POA: Insufficient documentation

## 2013-10-31 DIAGNOSIS — Z862 Personal history of diseases of the blood and blood-forming organs and certain disorders involving the immune mechanism: Secondary | ICD-10-CM | POA: Insufficient documentation

## 2013-10-31 DIAGNOSIS — W010XXA Fall on same level from slipping, tripping and stumbling without subsequent striking against object, initial encounter: Secondary | ICD-10-CM | POA: Insufficient documentation

## 2013-10-31 DIAGNOSIS — R51 Headache: Secondary | ICD-10-CM

## 2013-10-31 MED ORDER — ACETAMINOPHEN 325 MG PO TABS
650.0000 mg | ORAL_TABLET | Freq: Once | ORAL | Status: AC
  Administered 2013-10-31: 650 mg via ORAL
  Filled 2013-10-31: qty 2
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
## 2013-10-31 NOTE — ED Notes (Signed)
Dr Otter at bedside  
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
## 2013-10-31 NOTE — ED Notes (Signed)
[facility] [place].  Pt states she has had a headache all day.  Pt states she tripped and fell in the hall and hit her head on the railing in the hallway 45 minutes prior to arrival.  Pt states no change in headache after fall.  Fall is unwitnessed per EMS/denies neck or back pain

## 2013-11-01 MED ORDER — TRAMADOL HCL 50 MG PO TABS
50.0000 mg | ORAL_TABLET | Freq: Once | ORAL | Status: AC
  Administered 2013-11-01: 50 mg via ORAL
  Filled 2013-11-01: qty 1
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
## 2013-11-01 NOTE — ED Notes (Signed)
[facility] staff present at bedside to take patient home

## 2013-11-01 NOTE — ED Notes (Signed)
[facility] called and made aware of need [facility]

## 2013-11-01 NOTE — ED Notes (Signed)
Patient back from CT and in NAD

## 2013-11-01 NOTE — Discharge Instructions (Signed)
Fall Prevention and Home Safety Falls cause injuries and can affect all age groups. It is possible to use preventive measures to significantly decrease the likelihood of falls. There are many simple measures which can make your home safer and prevent falls. OUTDOORS  Repair cracks and edges of walkways and driveways.  Remove high doorway thresholds.  Trim shrubbery on the main path into your home.  Have good outside lighting.  Clear walkways of tools, rocks, debris, and clutter.  Check that handrails are not broken and are securely fastened. Both sides of steps should have handrails.  Have leaves, snow, and ice cleared regularly.  Use sand or salt on walkways during winter months.  In the garage, clean up grease or oil spills. BATHROOM  Install night lights.  Install grab bars by the toilet and in the tub and shower.  Use non-skid mats or decals in the tub or shower.  Place a plastic non-slip stool in the shower to sit on, if needed.  Keep floors dry and clean up all water on the floor immediately.  Remove soap buildup in the tub or shower on a regular basis.  Secure bath mats with non-slip, double-sided rug tape.  Remove throw rugs and tripping hazards from the floors. BEDROOMS  Install night lights.  Make sure a bedside light is easy to reach.  Do not use oversized bedding.  Keep a telephone by your bedside.  Have a firm chair with side arms to use for getting dressed.  Remove throw rugs and tripping hazards from the floor. KITCHEN  Keep handles on pots and pans turned toward the center of the stove. Use back burners when possible.  Clean up spills quickly and allow time for drying.  Avoid walking on wet floors.  Avoid hot utensils and knives.  Position shelves so they are not too high or low.  Place commonly used objects within easy reach.  If necessary, use a sturdy step stool with a grab bar when reaching.  Keep electrical cables out of the  way.  Do not use floor polish or wax that makes floors slippery. If you must use wax, use non-skid floor wax.  Remove throw rugs and tripping hazards from the floor. STAIRWAYS  Never leave objects on stairs.  Place handrails on both sides of stairways and use them. Fix any loose handrails. Make sure handrails on both sides of the stairways are as long as the stairs.  Check carpeting to make sure it is firmly attached along stairs. Make repairs to worn or loose carpet promptly.  Avoid placing throw rugs at the top or bottom of stairways, or properly secure the rug with carpet tape to prevent slippage. Get rid of throw rugs, if possible.  Have an electrician put in a light switch at the top and bottom of the stairs. OTHER FALL PREVENTION TIPS  Wear low-heel or rubber-soled shoes that are supportive and fit well. Wear closed toe shoes.  When using a stepladder, make sure it is fully opened and both spreaders are firmly locked. Do not climb a closed stepladder.  Add color or contrast paint or tape to grab bars and handrails in your home. Place contrasting color strips on first and last steps.  Learn and use mobility aids as needed. Install an electrical emergency response system.  Turn on lights to avoid dark areas. Replace light bulbs that burn out immediately. Get light switches that glow.  Arrange furniture to create clear pathways. Keep furniture in the same place.  Firmly attach carpet with non-skid or double-sided tape.  Eliminate uneven floor surfaces.  Select a carpet pattern that does not visually hide the edge of steps.  Be aware of all pets. OTHER HOME SAFETY TIPS  Set the water temperature for 120 F (48.8 C).  Keep emergency numbers on or near the telephone.  Keep smoke detectors on every level of the home and near sleeping areas. Document Released: 06/03/2002 Document Revised: 12/13/2011 Document Reviewed: 09/02/2011 [facility] Patient Information 2014  [place], [place].  Headaches, Frequently Asked Questions MIGRAINE HEADACHES Q: What is migraine? What causes it? How can I treat it? A: Generally, migraine headaches begin as a dull ache. Then they develop into a constant, throbbing, and pulsating pain. You may experience pain at the temples. You may experience pain at the front or back of one or both sides of the head. The pain is usually accompanied by a combination of:  Nausea.  Vomiting.  Sensitivity to light and noise. Some people (about 15%) experience an aura (see below) before an attack. The cause of migraine is believed to be chemical reactions in the brain. Treatment for migraine may include over-the-counter or prescription medications. It may also include self-help techniques. These include relaxation training and biofeedback.  Q: What is an aura? A: About 15% of people with migraine get an "aura". This is a sign of neurological symptoms that occur before a migraine headache. You may see wavy or jagged lines, dots, or flashing lights. You might experience tunnel vision or blind spots in one or both eyes. The aura can include visual or auditory hallucinations (something imagined). It may include disruptions in smell (such as strange odors), taste or touch. Other symptoms include:  Numbness.  A "pins and needles" sensation.  Difficulty in recalling or speaking the correct word. These neurological events may last as long as 60 minutes. These symptoms will fade as the headache begins. Q: What is a trigger? A: Certain physical or environmental factors can lead to or "trigger" a migraine. These include:  Foods.  Hormonal changes.  Weather.  Stress. It is important to remember that triggers are different for everyone. To help prevent migraine attacks, you need to figure out which triggers affect you. Keep a headache diary. This is a good way to track triggers. The diary will help you talk to your healthcare professional about your  condition. Q: Does weather affect migraines? A: Bright sunshine, hot, humid conditions, and drastic changes in barometric pressure may lead to, or "trigger," a migraine attack in some people. But studies have shown that weather does not act as a trigger for everyone with migraines. Q: What is the link between migraine and hormones? A: Hormones start and regulate many of your body's functions. Hormones keep your body in balance within a constantly changing environment. The levels of hormones in your body are unbalanced at times. Examples are during menstruation, pregnancy, or menopause. That can lead to a migraine attack. In fact, about three quarters of all women with migraine report that their attacks are related to the menstrual cycle.  Q: Is there an increased risk of stroke for migraine sufferers? A: The likelihood of a migraine attack causing a stroke is very remote. That is not to say that migraine sufferers cannot have a stroke associated with their migraines. In persons under age 39, the most common associated factor for stroke is migraine headache. But over the course of a person's normal life span, the occurrence of migraine headache may actually be  associated with a reduced risk of dying from cerebrovascular disease due to stroke.  Q: What are acute medications for migraine? A: Acute medications are used to treat the pain of the headache after it has started. Examples over-the-counter medications, NSAIDs, ergots, and triptans.  Q: What are the triptans? A: Triptans are the newest class of abortive medications. They are specifically targeted to treat migraine. Triptans are vasoconstrictors. They moderate some chemical reactions in the brain. The triptans work on receptors in your brain. Triptans help to restore the balance of a neurotransmitter called serotonin. Fluctuations in levels of serotonin are thought to be a main cause of migraine.  Q: Are over-the-counter medications for migraine  effective? A: Over-the-counter, or "OTC," medications may be effective in relieving mild to moderate pain and associated symptoms of migraine. But you should see your caregiver before beginning any treatment regimen for migraine.  Q: What are preventive medications for migraine? A: Preventive medications for migraine are sometimes referred to as "prophylactic" treatments. They are used to reduce the frequency, severity, and length of migraine attacks. Examples of preventive medications include antiepileptic medications, antidepressants, beta-blockers, calcium channel blockers, and NSAIDs (nonsteroidal anti-inflammatory drugs). Q: Why are anticonvulsants used to treat migraine? A: During the past few years, there has been an increased interest in antiepileptic drugs for the prevention of migraine. They are sometimes referred to as "anticonvulsants". Both epilepsy and migraine may be caused by similar reactions in the brain.  Q: Why are antidepressants used to treat migraine? A: Antidepressants are typically used to treat people with depression. They may reduce migraine frequency by regulating chemical levels, such as serotonin, in the brain.  Q: What alternative therapies are used to treat migraine? A: The term "alternative therapies" is often used to describe treatments considered outside the scope of conventional Western medicine. Examples of alternative therapy include acupuncture, acupressure, and yoga. Another common alternative treatment is herbal therapy. Some herbs are believed to relieve headache pain. Always discuss alternative therapies with your caregiver before proceeding. Some herbal products contain arsenic and other toxins. TENSION HEADACHES Q: What is a tension-type headache? What causes it? How can I treat it? A: Tension-type headaches occur randomly. They are often the result of temporary stress, anxiety, fatigue, or anger. Symptoms include soreness in your temples, a tightening  band-like sensation around your head (a "vice-like" ache). Symptoms can also include a pulling feeling, pressure sensations, and contracting head and neck muscles. The headache begins in your forehead, temples, or the back of your head and neck. Treatment for tension-type headache may include over-the-counter or prescription medications. Treatment may also include self-help techniques such as relaxation training and biofeedback. CLUSTER HEADACHES Q: What is a cluster headache? What causes it? How can I treat it? A: Cluster headache gets its name because the attacks come in groups. The pain arrives with little, if any, warning. It is usually on one side of the head. A tearing or bloodshot eye and a runny nose on the same side of the headache may also accompany the pain. Cluster headaches are believed to be caused by chemical reactions in the brain. They have been described as the most severe and intense of any headache type. Treatment for cluster headache includes prescription medication and oxygen. SINUS HEADACHES Q: What is a sinus headache? What causes it? How can I treat it? A: When a cavity in the bones of the face and skull (a sinus) becomes inflamed, the inflammation will cause localized pain. This condition is usually the  result of an allergic reaction, a tumor, or an infection. If your headache is caused by a sinus blockage, such as an infection, you will probably have a fever. An x-ray will confirm a sinus blockage. Your caregiver's treatment might include antibiotics for the infection, as well as antihistamines or decongestants.  REBOUND HEADACHES Q: What is a rebound headache? What causes it? How can I treat it? A: A pattern of taking acute headache medications too often can lead to a condition known as "rebound headache." A pattern of taking too much headache medication includes taking it more than 2 days per week or in excessive amounts. That means more than the label or a caregiver advises.  With rebound headaches, your medications not only stop relieving pain, they actually begin to cause headaches. Doctors treat rebound headache by tapering the medication that is being overused. Sometimes your caregiver will gradually substitute a different type of treatment or medication. Stopping may be a challenge. Regularly overusing a medication increases the potential for serious side effects. Consult a caregiver if you regularly use headache medications more than 2 days per week or more than the label advises. ADDITIONAL QUESTIONS AND ANSWERS Q: What is biofeedback? A: Biofeedback is a self-help treatment. Biofeedback uses special equipment to monitor your body's involuntary physical responses. Biofeedback monitors:  Breathing.  Pulse.  Heart rate.  Temperature.  Muscle tension.  Brain activity. Biofeedback helps you refine and perfect your relaxation exercises. You learn to control the physical responses that are related to stress. Once the technique has been mastered, you do not need the equipment any more. Q: Are headaches hereditary? A: Four out of five (80%) of people that suffer report a family history of migraine. Scientists are not sure if this is genetic or a family predisposition. Despite the uncertainty, a child has a 50% chance of having migraine if one parent suffers. The child has a 75% chance if both parents suffer.  Q: Can children get headaches? A: By the time they reach high school, most young people have experienced some type of headache. Many safe and effective approaches or medications can prevent a headache from occurring or stop it after it has begun.  Q: What type of doctor should I see to diagnose and treat my headache? A: Start with your primary caregiver. Discuss his or her experience and approach to headaches. Discuss methods of classification, diagnosis, and treatment. Your caregiver may decide to recommend you to a headache specialist, depending upon your  symptoms or other physical conditions. Having diabetes, allergies, etc., may require a more comprehensive and inclusive approach to your headache. The [employer] will provide, upon request, a list of [facility] physician members in your state. Document Released: 09/03/2003 Document Revised: 09/05/2011 Document Reviewed: 02/11/2008 [facility] Patient Information 2014 [place], [place]

## 2013-11-01 NOTE — ED Notes (Signed)
Patient resting in position of comfort with eyes closed RR WNL--even and unlabored with equal rise and fall of chest Patient in NAD Side rails up, call bell in reach  

## 2013-11-01 NOTE — ED Notes (Signed)
[facility] to facility Report given and all questions answered by this nurse [facility]

## 2016-11-17 ENCOUNTER — Emergency Department (HOSPITAL_COMMUNITY): Payer: Medicare Other

## 2016-11-17 ENCOUNTER — Encounter (HOSPITAL_COMMUNITY): Payer: Self-pay

## 2016-11-17 ENCOUNTER — Emergency Department (HOSPITAL_COMMUNITY)
Admission: EM | Admit: 2016-11-17 | Discharge: 2016-11-17 | Disposition: A | Discharge status: Home / Self Care | Payer: Medicare Other | Attending: Emergency Medicine | Admitting: Emergency Medicine

## 2016-11-17 DIAGNOSIS — W19XXXA Unspecified fall, initial encounter: Secondary | ICD-10-CM

## 2016-11-17 DIAGNOSIS — S0083XA Contusion of other part of head, initial encounter: Secondary | ICD-10-CM | POA: Diagnosis not present

## 2016-11-17 DIAGNOSIS — S0993XA Unspecified injury of face, initial encounter: Secondary | ICD-10-CM | POA: Diagnosis present

## 2016-11-17 DIAGNOSIS — Y929 Unspecified place or not applicable: Secondary | ICD-10-CM | POA: Diagnosis not present

## 2016-11-17 DIAGNOSIS — W050XXA Fall from non-moving wheelchair, initial encounter: Secondary | ICD-10-CM | POA: Diagnosis not present

## 2016-11-17 DIAGNOSIS — Y999 Unspecified external cause status: Secondary | ICD-10-CM | POA: Insufficient documentation

## 2016-11-17 DIAGNOSIS — Y939 Activity, unspecified: Secondary | ICD-10-CM | POA: Insufficient documentation

## 2016-11-17 DIAGNOSIS — N3000 Acute cystitis without hematuria: Secondary | ICD-10-CM | POA: Insufficient documentation

## 2016-11-17 DIAGNOSIS — Z87891 Personal history of nicotine dependence: Secondary | ICD-10-CM | POA: Diagnosis not present

## 2016-11-17 DIAGNOSIS — Z79899 Other long term (current) drug therapy: Secondary | ICD-10-CM | POA: Diagnosis not present

## 2016-11-17 DIAGNOSIS — F0390 Unspecified dementia without behavioral disturbance: Secondary | ICD-10-CM | POA: Diagnosis not present

## 2016-11-17 LAB — URINALYSIS, ROUTINE W REFLEX MICROSCOPIC
BILIRUBIN URINE: NEGATIVE
Glucose, UA: NEGATIVE mg/dL
Hgb urine dipstick: NEGATIVE
Ketones, ur: NEGATIVE mg/dL
NITRITE: NEGATIVE
PH: 5 (ref 5.0–8.0)
Protein, ur: NEGATIVE mg/dL
SPECIFIC GRAVITY, URINE: 1.013 (ref 1.005–1.030)

## 2016-11-17 MED ORDER — CEPHALEXIN 500 MG PO CAPS: 500.0000 mg | ORAL_CAPSULE | Freq: Three times a day (TID) | ORAL | 0 refills | Status: AC

## 2016-11-17 MED ORDER — LIDOCAINE HCL 1 % IJ SOLN
INTRAMUSCULAR | Status: AC
  Administered 2016-11-17: 20 mL
  Filled 2016-11-17: qty 20

## 2016-11-17 MED ORDER — CEFTRIAXONE SODIUM 1 G IJ SOLR
1.0000 g | Freq: Once | INTRAMUSCULAR | Status: AC
  Administered 2016-11-17: 1 g via INTRAMUSCULAR
  Filled 2016-11-17: qty 10

## 2016-11-17 NOTE — ED Triage Notes (Signed)
[facility] of [facility] wheelchair and has hematoma to left temple area-patient got up on own after fall-no LOC. Patient has severe dementia. BP 154/102 HR 78 O2 sat 98% RA. Patient has a c-collar in place-patient complaining of pain to head.

## 2016-11-17 NOTE — ED Notes (Signed)
Pt reports she fell and doen't remember events before or after her fall syncope presumed
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
## 2016-11-17 NOTE — ED Notes (Signed)
[facility] called for transport [facility]

## 2016-11-18 LAB — URINE CULTURE

## 2020-02-04 ENCOUNTER — Other Ambulatory Visit: Payer: Self-pay | Admitting: Physician Assistant

## 2020-02-04 DIAGNOSIS — N63 Unspecified lump in unspecified breast: Secondary | ICD-10-CM

## 2020-02-21 ENCOUNTER — Other Ambulatory Visit: Payer: Medicare Other

## 2020-03-16 ENCOUNTER — Other Ambulatory Visit: Payer: Medicare Other

## 2020-03-30 ENCOUNTER — Other Ambulatory Visit: Payer: Medicare Other

## 2020-04-03 ENCOUNTER — Ambulatory Visit
Admission: RE | Admit: 2020-04-03 | Discharge: 2020-04-03 | Disposition: A | Discharge status: Home / Self Care | Payer: Medicare Other | Source: Ambulatory Visit | Attending: Physician Assistant | Admitting: Physician Assistant

## 2020-04-03 ENCOUNTER — Other Ambulatory Visit: Payer: Self-pay | Admitting: Physician Assistant

## 2020-04-03 ENCOUNTER — Other Ambulatory Visit: Payer: Self-pay

## 2020-04-03 DIAGNOSIS — N63 Unspecified lump in unspecified breast: Secondary | ICD-10-CM

## 2024-05-27 ENCOUNTER — Emergency Department (HOSPITAL_COMMUNITY)
Admission: EM | Admit: 2024-05-27 | Discharge: 2024-05-27 | Disposition: A | Discharge status: Skilled Nursing Facility | Source: Skilled Nursing Facility | Attending: Emergency Medicine | Admitting: Emergency Medicine

## 2024-05-27 DIAGNOSIS — F0390 Unspecified dementia without behavioral disturbance: Secondary | ICD-10-CM | POA: Diagnosis not present

## 2024-05-27 DIAGNOSIS — J3489 Other specified disorders of nose and nasal sinuses: Secondary | ICD-10-CM | POA: Diagnosis present

## 2024-05-27 NOTE — ED Triage Notes (Addendum)
 Pt BIB EMS from [facility]. 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 Last years derm appt decided no need for intervention.However last night she scratched it to the point of bleeding and SNF called out. Pt is oriented to person only, dementia hx. Zeb if she received her morning meds today. HTN hx.   EMS vitals  BP 132/78 HR 84 SPO2 97 RA  CBG 122

## 2024-05-27 NOTE — Discharge Instructions (Signed)
 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  This can be electively removed, or remain in place.  These areas can sometimes bleed.  If they do, apply some gentle pressure and a bandage to the area.  She should follow-up with her primary care doctor at her scheduled appointment.

## 2024-05-27 NOTE — ED Provider Notes (Signed)
 [place] EMERGENCY DEPARTMENT AT [facility] Provider Note   CSN: [id number] Arrival date & time: 05/27/24  9283     Patient presents with: Cyst   Kelly Chandler is a 68 y.o. female.   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  Patient with a history of dementia, lives in a skilled nursing facility.  Patient scratched it last night, began to bleed and they called EMS for transport to the hospital.  Patient has followed with dermatology who did not recommend any intervention.        Prior to Admission medications   Medication Sig Start Date End Date Taking? Authorizing Provider  cephALEXin  (KEFLEX ) 500 MG capsule Take 1 capsule (500 mg total) by mouth 3 (three) times daily. 11/17/16   Baxter Drivers, MD  clonazePAM  (KLONOPIN ) 0.5 MG tablet Take one tablet by mouth at bedtime 09/10/13   Arnetha Heft, MD  FLUoxetine  (PROZAC ) 20 MG capsule Take 3 capsules (60 mg total) by mouth daily. 05/21/12   Etha Bame, MD  oxybutynin  (DITROPAN -XL) 10 MG 24 hr tablet TAKE 1 TABLET ONCE DAILY. 08/06/13   Cater, Lauraine ORN, MD  pravastatin  (PRAVACHOL ) 40 MG tablet Take 1 tablet (40 mg total) by mouth daily. 04/05/13   Cater, Lauraine ORN, MD  QUEtiapine  (SEROQUEL ) 100 MG tablet Take 1 tablet (100 mg total) by mouth at bedtime. 09/09/13   Evert Lonni NOVAK, MD  valproic  acid (DEPAKENE ) 250 MG capsule Take 1 capsule (250 mg total) by mouth every morning. 09/09/13   Evert Lonni NOVAK, MD  valproic  acid (DEPAKENE ) 250 MG capsule Take 2 capsules (500 mg total) by mouth every evening. 09/09/13   Evert Lonni NOVAK, MD    Allergies: Patient has no known allergies.    Review of Systems  Updated Vital Signs BP (!) 153/94   Temp 98.1 F (36.7 C)   Resp 18   Ht 5' 3 (1.6 m)   Wt 77.7 kg   SpO2 100%   BMI 30.34 kg/m   Physical Exam Vitals and nursing note reviewed.  HENT:     Nose:     Comments:  Marble sized mass on the left nasal ridge, no active bleeding but dried blood present Eyes:     Pupils: Pupils are equal, round, and reactive to light.  Cardiovascular:     Rate and Rhythm: Normal rate.  Neurological:     General: No focal deficit present.     (all labs ordered are listed, but only abnormal results are displayed) Labs Reviewed - No data to display  EKG: None  Radiology: No results found.   Procedures   Medications Ordered in the ED - No data to display                                  Medical Decision Making 68 year old female who is here today for bleeding from a chronic mass on the nose.  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  Did have some minor bleeding which has since resolved.  No emergent process present today.  Had nurse place dressing.  Will discharge.  This patient's health care is complicated by the following social determinants of health-multiple medical morbidities including dementia.  I reviewed the patient's most recent hospital admission note.  Patient's medications were reviewed.  Final diagnoses:  Nasal mass    ED Discharge Orders     None          Mannie Fairy DASEN, DO 05/27/24 [phone]

## 2024-05-27 NOTE — ED Notes (Signed)
 [facility] called. Awaiting pt trasport back to [facility] facility

## 2024-06-11 ENCOUNTER — Ambulatory Visit: Admitting: Dermatology

## 2024-08-14 ENCOUNTER — Ambulatory Visit: Admitting: Dermatology
